# Patient Record
Sex: Female | Born: 1970 | Race: White | Hispanic: No | Marital: Married | Smoking: Never smoker
Health system: Southern US, Community
[De-identification: ages and names within clinical notes are randomized; demographics above are authoritative.]

## PROBLEM LIST (undated history)

## (undated) DIAGNOSIS — G43909 Migraine, unspecified, not intractable, without status migrainosus: Secondary | ICD-10-CM

## (undated) DIAGNOSIS — R87629 Unspecified abnormal cytological findings in specimens from vagina: Secondary | ICD-10-CM

## (undated) HISTORY — PX: REDUCTION MAMMAPLASTY: SUR839

## (undated) HISTORY — DX: Migraine, unspecified, not intractable, without status migrainosus: G43.909

## (undated) HISTORY — PX: WISDOM TOOTH EXTRACTION: SHX21

## (undated) HISTORY — PX: CHOLECYSTECTOMY: SHX55

## (undated) HISTORY — PX: BREAST SURGERY: SHX581

## (undated) HISTORY — DX: Unspecified abnormal cytological findings in specimens from vagina: R87.629

---

## 2004-12-07 ENCOUNTER — Emergency Department (HOSPITAL_COMMUNITY): Admission: EM | Admit: 2004-12-07 | Discharge: 2004-12-07 | Payer: Self-pay | Admitting: Emergency Medicine

## 2004-12-09 ENCOUNTER — Ambulatory Visit (HOSPITAL_COMMUNITY): Admission: RE | Admit: 2004-12-09 | Discharge: 2004-12-09 | Payer: Self-pay | Admitting: Emergency Medicine

## 2004-12-15 ENCOUNTER — Emergency Department (HOSPITAL_COMMUNITY): Admission: EM | Admit: 2004-12-15 | Discharge: 2004-12-15 | Payer: Self-pay | Admitting: Emergency Medicine

## 2004-12-20 ENCOUNTER — Observation Stay (HOSPITAL_COMMUNITY): Admission: RE | Admit: 2004-12-20 | Discharge: 2004-12-21 | Payer: Self-pay | Admitting: General Surgery

## 2008-12-29 ENCOUNTER — Emergency Department (HOSPITAL_COMMUNITY): Admission: EM | Admit: 2008-12-29 | Discharge: 2008-12-29 | Payer: Self-pay | Admitting: Emergency Medicine

## 2009-05-28 ENCOUNTER — Ambulatory Visit (HOSPITAL_BASED_OUTPATIENT_CLINIC_OR_DEPARTMENT_OTHER): Admission: RE | Admit: 2009-05-28 | Discharge: 2009-05-29 | Payer: Self-pay | Admitting: Specialist

## 2009-05-28 ENCOUNTER — Encounter (INDEPENDENT_AMBULATORY_CARE_PROVIDER_SITE_OTHER): Payer: Self-pay | Admitting: Specialist

## 2010-10-16 LAB — POCT HEMOGLOBIN-HEMACUE: Hemoglobin: 14.8 g/dL (ref 12.0–15.0)

## 2010-10-21 LAB — URINE MICROSCOPIC-ADD ON

## 2010-10-21 LAB — URINALYSIS, ROUTINE W REFLEX MICROSCOPIC
Bilirubin Urine: NEGATIVE
Glucose, UA: NEGATIVE mg/dL
Ketones, ur: NEGATIVE mg/dL
Leukocytes, UA: NEGATIVE
Nitrite: NEGATIVE
Specific Gravity, Urine: 1.015 (ref 1.005–1.030)
Urobilinogen, UA: 1 mg/dL (ref 0.0–1.0)
pH: 7.5 (ref 5.0–8.0)

## 2010-11-29 NOTE — H&P (Signed)
Samantha Schroeder, Samantha Schroeder                ACCOUNT NO.:  [id number]   MEDICAL RECORD NO.:  [id number]          PATIENT TYPE:  AMB   LOCATION:  DAY                           FACILITY:  [facility]   PHYSICIAN:  Jerolyn Shin C. Katrinka Blazing, M.D.   DATE OF BIRTH:  1970-11-13   DATE OF ADMISSION:  DATE OF DISCHARGE:  LH                                HISTORY & PHYSICAL   HISTORY OF PRESENT ILLNESS:  A 40 year old female with a history of  recurrent abdominal pain.  She has had 2 major attacks.  Her pain is  epigastric with radiation into the right side, but not to the back.  She has  nausea, but no vomiting or diarrhea.  Ultrasound revealed multiple  gallstones.  The patient is scheduled for a cholecystectomy.   PAST HISTORY:  No major medical illnesses.   MEDICATIONS:  No chronic medications.   ALLERGIES:  No allergies.   PAST SURGICAL HISTORY:  No surgeries.   FAMILY HISTORY:  Positive for diabetes mellitus, congestive heart failure,  hypertension, atherosclerotic heart disease, stroke, and depression.   SOCIAL HISTORY:  She is married with 2 children.  Works as an Engineer, manufacturing.  Drinks an occasional alcoholic beverage.  No tobacco use.  No drug  use.   PHYSICAL EXAMINATION:  VITAL SIGNS:  Blood pressure 120/90, pulse 76,  respirations 20, weight 139 pounds.  HEENT:  Unremarkable.  NECK:  Supple.  No JVD, bruit, adenopathy, or thyromegaly.  CHEST:  Clear to auscultation.  HEART:  Regular rate and rhythm without murmur, gallop, or rub.  ABDOMEN:  Mild epigastric tenderness.  No right upper quadrant tenderness.  Normoactive bowel sounds.  No hepatosplenomegaly.  EXTREMITIES:  No clubbing, cyanosis, or edema.  NEUROLOGIC:  No focal motor, sensory, or cerebellar deficit.   IMPRESSION:  Cholelithiasis with cholecystitis.   PLAN:  Laparoscopic cholecystectomy.       LCS/MEDQ  D:  12/19/2004  T:  12/20/2004  Job:  [id number]

## 2010-11-29 NOTE — Op Note (Signed)
NAMECHEVY, Samantha Schroeder                ACCOUNT NO.:  [id number]   MEDICAL RECORD NO.:  [id number]          PATIENT TYPE:  OBV   LOCATION:  A326                          FACILITY:  [facility]   PHYSICIAN:  Dirk Dress. Katrinka Blazing, M.D.   DATE OF BIRTH:  1971/04/12   DATE OF PROCEDURE:  12/20/2004  DATE OF DISCHARGE:                                 OPERATIVE REPORT   PREOPERATIVE DIAGNOSIS:  Cholelithiasis, cholecystitis.   POSTOPERATIVE DIAGNOSES:  1.  Cholelithiasis, cholecystitis.  2.  Intraperitoneal hemorrhage of uncertain etiology.   PROCEDURE:  Laparoscopic cholecystectomy, peritoneal lavage, pelvic  laparoscopy.   SURGEON:  Dr. Katrinka Blazing.   DESCRIPTION:  Under general anesthesia, the patient's abdomen was prepped  and draped in sterile field. Supraumbilical incision was made, and Veress  needle was inserted uneventfully. Using a Visiport guide, a 10-mm port was  placed. Upon placing the laparoscopic, there was evidence of old blood in  the peritoneal cavity. There was old blood over the omentum that was sort of  in a speckled pattern. There was old blood in the left subphrenic area with  some chronic adhesions surrounding the spleen. There was blood between loops  of bowel and following the colon in the gutters. There was also a large  volume of blood in the pelvis over the uterus and in the cul-de-sac. This  was very, very blood which was not clotted. The uterus was stuck to the  anterior abdominal wall anteriorly, suggesting previous Cesarean section.  The ovaries appeared to be normal bilaterally. Left and right ovary were  fully evaluated. The tubes were normal bilaterally. Once the uterus was  evaluated, it appeared to be multilobular and nodular but was soft. The  bowel was followed starting at the cecum and extending up to the subhepatic  space and starting in the rectum and extending up to the splenic flexure.  The omentum was irrigated and moved with peanut dissectors, and there was  no  evidence of any primary bleeding involved in the small bowel or the  mesentery. Copious irrigation of the pelvis was carried out, and once these  was done, there was no residual bleeding noted. The area of the spleen was  irrigated, and the subhepatic spaces were both irrigated, and once this was  done, there did not appear to be any further bleeding. This appeared to be  very old blood. It was very thick but not clotted. It was elected to proceed  with the cholecystectomy. The ports in the suprapubic midline and the left  lower quadrant were left in place. New ports were placed in the right  subcostal region under fluoroscopic guidance. Gallbladder was grasped and  positioned. Cystic artery was a large vessel that extended onto the wall of  the gallbladder. It was dissected, clipped with four clips and divided  closed to the gallbladder. Cystic duct was short. It was dissected, clipped  closed to the gallbladder with five clips and divided. Gallbladder was then  separated from the intrahepatic space without difficulty. Was placed in an  EndoCatch device and retrieved. Irrigation was carried out. There  was no  fresh bleeding, but just to make sure, I elected to place a JP drain in the  right subhepatic space. This was brought out through the most lateral port  site. CO2 was allowed to escape from the abdomen, and the ports were  removed. The incisions were closed. The fascia of the supraumbilical  incision and the right upper quadrant paramedian incision  was closed with 0 Vicryl. All of the other skin incisions were closed with  staples. OpSite dressings were placed. The patient tolerated the procedure  well. She was awakened from anesthesia uneventfully, transferred to a bed,  and taken to the post anesthetic care unit for monitoring.       LCS/MEDQ  D:  12/20/2004  T:  12/20/2004  Job:  [id number]   cc:   Madelin Rear. Sherwood Gambler, MD  [id number]  [place]  [place] [address]  Fax:  [phone]

## 2011-10-07 ENCOUNTER — Ambulatory Visit: Payer: Self-pay | Admitting: Urology

## 2013-03-28 ENCOUNTER — Encounter (HOSPITAL_COMMUNITY): Payer: Self-pay | Admitting: *Deleted

## 2013-03-28 ENCOUNTER — Emergency Department (HOSPITAL_COMMUNITY)
Admission: EM | Admit: 2013-03-28 | Discharge: 2013-03-28 | Disposition: A | Discharge status: Home / Self Care | Payer: Self-pay | Attending: Emergency Medicine | Admitting: Emergency Medicine

## 2013-03-28 ENCOUNTER — Emergency Department (HOSPITAL_COMMUNITY): Payer: Self-pay

## 2013-03-28 DIAGNOSIS — K7689 Other specified diseases of liver: Secondary | ICD-10-CM

## 2013-03-28 DIAGNOSIS — Z3202 Encounter for pregnancy test, result negative: Secondary | ICD-10-CM | POA: Insufficient documentation

## 2013-03-28 DIAGNOSIS — Y9289 Other specified places as the place of occurrence of the external cause: Secondary | ICD-10-CM | POA: Insufficient documentation

## 2013-03-28 DIAGNOSIS — R296 Repeated falls: Secondary | ICD-10-CM | POA: Insufficient documentation

## 2013-03-28 DIAGNOSIS — N200 Calculus of kidney: Secondary | ICD-10-CM

## 2013-03-28 DIAGNOSIS — Y9389 Activity, other specified: Secondary | ICD-10-CM | POA: Insufficient documentation

## 2013-03-28 DIAGNOSIS — N281 Cyst of kidney, acquired: Secondary | ICD-10-CM

## 2013-03-28 DIAGNOSIS — IMO0002 Reserved for concepts with insufficient information to code with codable children: Secondary | ICD-10-CM | POA: Insufficient documentation

## 2013-03-28 LAB — URINE MICROSCOPIC-ADD ON

## 2013-03-28 LAB — BASIC METABOLIC PANEL
BUN: 18 mg/dL (ref 6–23)
CO2: 28 mEq/L (ref 19–32)
Calcium: 9.9 mg/dL (ref 8.4–10.5)
Chloride: 103 mEq/L (ref 96–112)
Creatinine, Ser: 1.03 mg/dL (ref 0.50–1.10)
GFR calc Af Amer: 78 mL/min — ABNORMAL LOW (ref >90)
GFR calc non Af Amer: 67 mL/min — ABNORMAL LOW (ref >90)
Glucose, Bld: 101 mg/dL — ABNORMAL HIGH (ref 70–99)
Potassium: 3.4 mEq/L — ABNORMAL LOW (ref 3.5–5.1)
Sodium: 139 mEq/L (ref 135–145)

## 2013-03-28 LAB — CBC WITH DIFFERENTIAL/PLATELET
Basophils Absolute: 0 10*3/uL (ref 0.0–0.1)
Basophils Relative: 0 % (ref 0–1)
Eosinophils Absolute: 0.1 10*3/uL (ref 0.0–0.7)
Eosinophils Relative: 1 % (ref 0–5)
HCT: 41.5 % (ref 36.0–46.0)
Hemoglobin: 14 g/dL (ref 12.0–15.0)
Lymphocytes Relative: 31 % (ref 12–46)
Lymphs Abs: 2.8 10*3/uL (ref 0.7–4.0)
MCH: 29.2 pg (ref 26.0–34.0)
MCHC: 33.7 g/dL (ref 30.0–36.0)
MCV: 86.6 fL (ref 78.0–100.0)
Monocytes Absolute: 0.8 10*3/uL (ref 0.1–1.0)
Monocytes Relative: 8 % (ref 3–12)
Neutro Abs: 5.5 10*3/uL (ref 1.7–7.7)
Neutrophils Relative %: 59 % (ref 43–77)
Platelets: 224 10*3/uL (ref 150–400)
RBC: 4.79 MIL/uL (ref 3.87–5.11)
RDW: 12.3 % (ref 11.5–15.5)
WBC: 9.2 10*3/uL (ref 4.0–10.5)

## 2013-03-28 LAB — URINALYSIS, ROUTINE W REFLEX MICROSCOPIC
Bilirubin Urine: NEGATIVE
Glucose, UA: NEGATIVE mg/dL
Ketones, ur: NEGATIVE mg/dL
Nitrite: NEGATIVE
Protein, ur: NEGATIVE mg/dL
Specific Gravity, Urine: >1.03 — ABNORMAL HIGH (ref 1.005–1.030)
Urobilinogen, UA: 0.2 mg/dL (ref 0.0–1.0)
pH: 6 (ref 5.0–8.0)

## 2013-03-28 LAB — POCT PREGNANCY, URINE: Preg Test, Ur: NEGATIVE

## 2013-03-28 MED ORDER — SODIUM CHLORIDE 0.9 % IV SOLN
1000.0000 mL | INTRAVENOUS | Status: DC
  Administered 2013-03-28: 1000 mL via INTRAVENOUS

## 2013-03-28 MED ORDER — ONDANSETRON HCL 4 MG/2ML IJ SOLN
4.0000 mg | Freq: Once | INTRAMUSCULAR | Status: AC
  Administered 2013-03-28: 4 mg via INTRAVENOUS
  Filled 2013-03-28: qty 2

## 2013-03-28 MED ORDER — NAPROXEN 500 MG PO TABS: 500.0000 mg | ORAL_TABLET | Freq: Two times a day (BID) | ORAL | Status: DC

## 2013-03-28 MED ORDER — TAMSULOSIN HCL 0.4 MG PO CAPS: 0.4000 mg | ORAL_CAPSULE | Freq: Two times a day (BID) | ORAL | Status: DC

## 2013-03-28 MED ORDER — HYDROMORPHONE HCL PF 1 MG/ML IJ SOLN
1.0000 mg | Freq: Once | INTRAMUSCULAR | Status: AC
  Administered 2013-03-28: 1 mg via INTRAVENOUS
  Filled 2013-03-28: qty 1

## 2013-03-28 MED ORDER — SODIUM CHLORIDE 0.9 % IV SOLN
1000.0000 mL | Freq: Once | INTRAVENOUS | Status: AC
  Administered 2013-03-28: 1000 mL via INTRAVENOUS

## 2013-03-28 MED ORDER — PROMETHAZINE HCL 25 MG PO TABS: 25.0000 mg | ORAL_TABLET | Freq: Four times a day (QID) | ORAL | Status: DC | PRN

## 2013-03-28 MED ORDER — OXYCODONE-ACETAMINOPHEN 5-325 MG PO TABS
2.0000 | ORAL_TABLET | Freq: Once | ORAL | Status: AC
  Administered 2013-03-28: 2 via ORAL
  Filled 2013-03-28: qty 2

## 2013-03-28 MED ORDER — OXYCODONE-ACETAMINOPHEN 5-325 MG PO TABS: 1.0000 | ORAL_TABLET | ORAL | Status: DC | PRN

## 2013-03-28 MED ORDER — KETOROLAC TROMETHAMINE 30 MG/ML IJ SOLN
30.0000 mg | Freq: Once | INTRAMUSCULAR | Status: AC
  Administered 2013-03-28: 30 mg via INTRAVENOUS
  Filled 2013-03-28: qty 1

## 2013-03-28 NOTE — ED Notes (Signed)
Pt c/o lower mid to left sided back pain that radiates around to her left groin. Decreased urine output and pain upon urination. Denies n/v.

## 2013-03-28 NOTE — ED Provider Notes (Signed)
CSN: [id number]     Arrival date & time 03/28/13  0608 History   First MD Initiated Contact with Patient 03/28/13 [phone]     Chief Complaint  Patient presents with  . Back Pain  . Groin Pain   (Consider location/radiation/quality/duration/timing/severity/associated sxs/prior Treatment) Patient is a 42 y.o. female presenting with back pain and groin pain. The history is provided by the patient.  Back Pain Groin Pain  She states that yesterday she fell landing on her butt. Later in the day, she started having pain in her left flank area radiating around to her left groin. She took Aleve with relief of pain but does continue to get worse. This morning, she took Aleve with no relief of pain. Pain is severe and she rates it at 8/10. Nothing makes it better nothing makes it worse. This has been associated with since she has to urinate but inability to urinate. She had nausea yesterday but none today. She has not vomited. She denies fever, chills, sweats. She's not had similar symptoms before.  History reviewed. No pertinent past medical history. No past surgical history on file. No family history on file. History  Substance Use Topics  . Smoking status: Not on file  . Smokeless tobacco: Not on file  . Alcohol Use: Not on file   OB History   Grav Para Term Preterm Abortions TAB SAB Ect Mult Living                 Review of Systems  Musculoskeletal: Positive for back pain.  All other systems reviewed and are negative.    Allergies  Review of patient's allergies indicates not on file.  Home Medications  No current outpatient prescriptions on file. BP 155/96  Temp(Src) 98.3 F (36.8 C) (Oral)  Resp 20  Ht 5\' 4"  (1.626 m)  Wt 175 lb (79.379 kg)  BMI 30.02 kg/m2  SpO2 100% Physical Exam  Nursing note and vitals reviewed.  42 year old female, who appears mildly uncomfortable, but is in no acute distress. Vital signs are significant for hypertension with blood pressure 155/96.  Oxygen saturation is 100%, which is normal. Head is normocephalic and atraumatic. PERRLA, EOMI. Oropharynx is clear. Neck is nontender and supple without adenopathy or JVD. Back is nontender and there is mild left CVA tenderness. Lungs are clear without rales, wheezes, or rhonchi. Chest is nontender. Heart has regular rate and rhythm without murmur. Abdomen is soft, flat, with moderate left mid and lower abdominal tenderness. There is no rebound or guarding. There are no masses or hepatosplenomegaly and peristalsis is hypoactive. Extremities have no cyanosis or edema, full range of motion is present. Skin is warm and dry without rash. Neurologic: Mental status is normal, cranial nerves are intact, there are no motor or sensory deficits.  ED Course  Procedures (including critical care time) Labs Review Results for orders placed during the hospital encounter of 03/28/13  CBC WITH DIFFERENTIAL      Result Value Range   WBC 9.2  4.0 - 10.5 K/uL   RBC 4.79  3.87 - 5.11 MIL/uL   Hemoglobin 14.0  12.0 - 15.0 g/dL   HCT 11.9  14.7 - 82.9 %   MCV 86.6  78.0 - 100.0 fL   MCH 29.2  26.0 - 34.0 pg   MCHC 33.7  30.0 - 36.0 g/dL   RDW 56.2  13.0 - 86.5 %   Platelets 224  150 - 400 K/uL   Neutrophils Relative % 59  43 - 77 %   Neutro Abs 5.5  1.7 - 7.7 K/uL   Lymphocytes Relative 31  12 - 46 %   Lymphs Abs 2.8  0.7 - 4.0 K/uL   Monocytes Relative 8  3 - 12 %   Monocytes Absolute 0.8  0.1 - 1.0 K/uL   Eosinophils Relative 1  0 - 5 %   Eosinophils Absolute 0.1  0.0 - 0.7 K/uL   Basophils Relative 0  0 - 1 %   Basophils Absolute 0.0  0.0 - 0.1 K/uL  BASIC METABOLIC PANEL      Result Value Range   Sodium 139  135 - 145 mEq/L   Potassium 3.4 (*) 3.5 - 5.1 mEq/L   Chloride 103  96 - 112 mEq/L   CO2 28  19 - 32 mEq/L   Glucose, Bld 101 (*) 70 - 99 mg/dL   BUN 18  6 - 23 mg/dL   Creatinine, Ser 4.09  0.50 - 1.10 mg/dL   Calcium 9.9  8.4 - 81.1 mg/dL   GFR calc non Af Amer 67 (*) >90 mL/min    GFR calc Af Amer 78 (*) >90 mL/min  URINALYSIS, ROUTINE W REFLEX MICROSCOPIC      Result Value Range   Color, Urine YELLOW  YELLOW   APPearance CLEAR  CLEAR   Specific Gravity, Urine >1.030 (*) 1.005 - 1.030   pH 6.0  5.0 - 8.0   Glucose, UA NEGATIVE  NEGATIVE mg/dL   Hgb urine dipstick LARGE (*) NEGATIVE   Bilirubin Urine NEGATIVE  NEGATIVE   Ketones, ur NEGATIVE  NEGATIVE mg/dL   Protein, ur NEGATIVE  NEGATIVE mg/dL   Urobilinogen, UA 0.2  0.0 - 1.0 mg/dL   Nitrite NEGATIVE  NEGATIVE   Leukocytes, UA TRACE (*) NEGATIVE  URINE MICROSCOPIC-ADD ON      Result Value Range   Squamous Epithelial / LPF MANY (*) RARE   WBC, UA 0-2  <3 WBC/hpf   RBC / HPF 7-10  <3 RBC/hpf   Bacteria, UA MANY (*) RARE  POCT PREGNANCY, URINE      Result Value Range   Preg Test, Ur NEGATIVE  NEGATIVE    Imaging Review Ct Abdomen Pelvis Wo Contrast  03/28/2013   *RADIOLOGY REPORT*  Clinical Data: Left flank and groin pain.  CT ABDOMEN AND PELVIS WITHOUT CONTRAST  Technique:  Multidetector CT imaging of the abdomen and pelvis was performed following the standard protocol without intravenous contrast.  Comparison: 12/09/2004 ultrasound.  Findings: 4.7 mm stone at the left ureteral vesicle junction with moderate left-sided hydroureteronephrosis. Fluid surrounds the left kidney is slightly enlarged.  Post cholecystectomy.  Left lobe liver low density structures measuring up to 1.5 cm with larger low density structure a cyst and smaller low density structure too small to adequately characterize. Evaluation of solid abdominal viscera is limited by lack of IV contrast.  Taking this limitation into account no worrisome hepatic, splenic, pancreatic, adrenal or left renal mass.  Within the lower pole of the right kidney is a 2 cm low density structure suggestive of a cyst.  Lung bases clear.  Heart size within normal limits.  No abdominal aortic aneurysm.  Retroverted uterus with IUD in place.  No bony destructive lesion.   IMPRESSION: 4.7 mm stone at the left ureteral vesicle junction with moderate left-sided hydroureteronephrosis. Fluid surrounds the left kidney is slightly enlarged.  Low density structures within the left lobe of the liver and lower aspect of the right  kidney suggestive of cysts.   Original Report Authenticated By: Lacy Duverney, M.D.    MDM   1. Kidney stone on left side   2. Renal cyst   3. Hepatic cyst    Left flank pain radiating to the groin suspicious for ureteral colic. Also consider possibility of urinary tract infection. Urine sample will be obtained and she will be sent for CT of her abdomen and pelvis without contrast. She'll be given IV fluids, IV hydromorphone, and IV ondansetron. Old records are reviewed and I see no relevant past visits. She has not had imaging of her abdomen since an ultrasound in 2006. There is no mention of renal calculi at that time.    Dione Booze, MD 03/29/13 [phone]

## 2013-03-28 NOTE — ED Notes (Signed)
Pt alert & oriented x4, stable gait. Patient given discharge instructions, paperwork & prescription(s). Patient  instructed to stop at the registration desk to finish any additional paperwork. Patient verbalized understanding. Pt left department w/ no further questions. 

## 2013-03-28 NOTE — ED Provider Notes (Signed)
Pt informed of CT results - stable for d/c.  Meds given in ED:  Medications  0.9 %  sodium chloride infusion (0 mLs Intravenous Stopped 03/28/13 0726)    Followed by  0.9 %  sodium chloride infusion (1,000 mLs Intravenous New Bag/Given 03/28/13 0730)  ketorolac (TORADOL) 30 MG/ML injection 30 mg (not administered)  oxyCODONE-acetaminophen (PERCOCET/ROXICET) 5-325 MG per tablet 2 tablet (not administered)  HYDROmorphone (DILAUDID) injection 1 mg (1 mg Intravenous Given 03/28/13 0643)  ondansetron (ZOFRAN) injection 4 mg (4 mg Intravenous Given 03/28/13 0643)    New Prescriptions   NAPROXEN (NAPROSYN) 500 MG TABLET    Take 1 tablet (500 mg total) by mouth 2 (two) times daily with a meal.   OXYCODONE-ACETAMINOPHEN (PERCOCET) 5-325 MG PER TABLET    Take 1 tablet by mouth every 4 (four) hours as needed for pain.   PROMETHAZINE (PHENERGAN) 25 MG TABLET    Take 1 tablet (25 mg total) by mouth every 6 (six) hours as needed for nausea.   TAMSULOSIN (FLOMAX) 0.4 MG CAPS CAPSULE    Take 1 capsule (0.4 mg total) by mouth 2 (two) times daily.      Vida Roller, MD 03/28/13 [phone]

## 2013-07-14 DIAGNOSIS — R87629 Unspecified abnormal cytological findings in specimens from vagina: Secondary | ICD-10-CM

## 2013-07-14 HISTORY — DX: Unspecified abnormal cytological findings in specimens from vagina: R87.629

## 2014-02-01 LAB — HM PAP SMEAR: HM Pap smear: NEGATIVE

## 2014-03-06 ENCOUNTER — Encounter: Payer: Self-pay | Admitting: Obstetrics and Gynecology

## 2014-03-06 ENCOUNTER — Ambulatory Visit (INDEPENDENT_AMBULATORY_CARE_PROVIDER_SITE_OTHER): Payer: BC Managed Care – PPO | Admitting: Obstetrics and Gynecology

## 2014-03-06 VITALS — BP 140/82 | Ht 64.0 in | Wt 165.6 lb

## 2014-03-06 DIAGNOSIS — N946 Dysmenorrhea, unspecified: Secondary | ICD-10-CM

## 2014-03-06 NOTE — Progress Notes (Signed)
This chart was scribed by Leone Payor, Medical Scribe, for Dr. Christin Bach on 03/06/14 at 4:19 PM. This chart was reviewed by Dr. Christin Bach for accuracy.   [facility] Visit  Patient name: Samantha Schroeder MRN [id number]  Date of birth: 09/17/1970  CC & HPI:  Samantha Schroeder is a 43 y.o. female presenting today for second opinion on hysterectomy. Patient states she had an IUD replaced earlier this year. She reports having complications with it so she had it removed. Patient states she was told to have a vaginal hysterectomy but she would like to have a second opinion.  Pt has had 2 iud's the first worked after the first 8 months to pt satisfaction, the second was never satisfactory, and workup well documented. Excellent records reviewed , showing u/s documenting u/s abnormality suggestive of adenomyosis.. In a small uterus with no significant endometrial thickening.  ROS:  +dysmenorrhea   Pertinent History Reviewed:   Reviewed: Significant for 2 vaginal deliveries  Medical Hx:        Past Medical History  Diagnosis Date  . Migraine                               Surgical Hx:    Past Surgical History  Procedure Laterality Date  . Cholecystectomy    . Breast surgery      reduction  . Wisdom tooth extraction     Medications: Reviewed & Updated - see associated section                      Current outpatient prescriptions:Loratadine (CLARITIN PO), Take by mouth as needed., Disp: , Rfl: ;  naproxen (NAPROSYN) 500 MG tablet, Take 1 tablet (500 mg total) by mouth 2 (two) times daily with a meal., Disp: 30 tablet, Rfl: 0;  SUMAtriptan (IMITREX) 50 MG tablet, Take 50 mg by mouth every 2 (two) hours as needed for migraine or headache. May repeat in 2 hours if headache persists or recurs., Disp: , Rfl:  oxyCODONE-acetaminophen (PERCOCET) 5-325 MG per tablet, Take 1 tablet by mouth every 4 (four) hours as needed for pain., Disp: 20 tablet, Rfl: 0;  promethazine (PHENERGAN) 25 MG tablet,  Take 1 tablet (25 mg total) by mouth every 6 (six) hours as needed for nausea., Disp: 12 tablet, Rfl: 0;  tamsulosin (FLOMAX) 0.4 MG CAPS capsule, Take 1 capsule (0.4 mg total) by mouth 2 (two) times daily., Disp: 10 capsule, Rfl: 0   Social History: Reviewed -  reports that she has never smoked. She does not have any smokeless tobacco history on file.  Objective Findings:  Vitals: Blood pressure 140/82, height  (1.626 m), weight 165 lb 9.6 oz (75.116 kg), last menstrual period 03/05/2014.  Physical Examination: General appearance - alert, well appearing, and in no distress and oriented to person, place, and time Mental status - alert, oriented to person, place, and time, normal mood, behavior, speech, dress, motor activity, and thought processes Pelvic - examination not indicated  Surgery discussion: at least 25 min  Assessment & Plan:   A:  1. Dysmenorrhea  2. Adenomyosis  3. Status post failed IUD   P:  1. Treatment regimen reviewed, agreed with proceeding toward hysterectomy, vaginal vs other techniques discussed (such as LAVH) 2. All other options, surgical technique, discussed including salpingectomy

## 2014-03-13 ENCOUNTER — Telehealth: Payer: Self-pay | Admitting: Obstetrics and Gynecology

## 2014-03-13 NOTE — Telephone Encounter (Signed)
I spoke with JVF about the pt's questions and he advised that the pt would need an appointment to discuss options and that he would go over her chart and discuss with Dr.McLeod.   I will notify pt of this.

## 2014-03-13 NOTE — Telephone Encounter (Signed)
Pt states that she was seen a week or so ago for a second opinion. Pt states that JVF told her that he could take her tubes out as well. Pt has thought over everything and now she thinks she wants an abdominal hysterectomy with a tube removal and a tummy tuck. I did advise the pt that the tummy tuck is not usually covered by your insurance, pt asked if there was a way to work a round all of that. Pt sates that he did this same procedure for one of her friends in the past.  I advised the pt that I would discuss all of this with Dr. Emelda Fear and call her back, pt aware that he is seeing pt's all day and that it would be later before she heard from [place].

## 2014-03-13 NOTE — Telephone Encounter (Signed)
Call transferred to front staff for an appt to be made with Dr. Emelda Fear to discuss pt concerns and questions.

## 2014-03-16 ENCOUNTER — Ambulatory Visit (INDEPENDENT_AMBULATORY_CARE_PROVIDER_SITE_OTHER): Payer: BC Managed Care – PPO | Admitting: Obstetrics and Gynecology

## 2014-03-16 ENCOUNTER — Encounter: Payer: Self-pay | Admitting: Obstetrics and Gynecology

## 2014-03-16 VITALS — BP 142/88 | Ht 64.0 in | Wt 166.0 lb

## 2014-03-16 DIAGNOSIS — N8000 Endometriosis of the uterus, unspecified: Secondary | ICD-10-CM

## 2014-03-16 DIAGNOSIS — N8003 Adenomyosis of the uterus: Secondary | ICD-10-CM

## 2014-03-16 DIAGNOSIS — N809 Endometriosis, unspecified: Principal | ICD-10-CM

## 2014-03-16 MED ORDER — PHENTERMINE HCL 37.5 MG PO CAPS: 37.5000 mg | ORAL_CAPSULE | ORAL | Status: DC

## 2014-03-16 NOTE — Progress Notes (Signed)
Patient ID: UMI MAINOR, female   DOB: 1971/03/25, 43 y.o.   MRN: [id number] Pt here today    [facility] Visit  Patient name: ANAMARI GALEAS MRN [id number]  Date of birth: [birth year]  CC & HPI:  SAMARI BITTINGER is a 43 y.o. female presenting today to discuss surgery one more time. She would like to have the surgery within the next couple of months.  She las lost 15 pounds since March 2015.Pt states that since her last visit she has found out that her maternal great aunt had ovarian cancer.     ROS:  All systems are reviewed and negative unless otherwise specified in the HPI.    Pertinent History Reviewed:   Reviewed:  Medical         Past Medical History  Diagnosis Date  . Migraine                               Surgical Hx:    Past Surgical History  Procedure Laterality Date  . Cholecystectomy    . Breast surgery      reduction  . Wisdom tooth extraction     Medications: Reviewed & Updated - see associated section                      Current outpatient prescriptions:Loratadine (CLARITIN PO), Take by mouth as needed., Disp: , Rfl: ;  SUMAtriptan (IMITREX) 50 MG tablet, Take 50 mg by mouth every 2 (two) hours as needed for migraine or headache. May repeat in 2 hours if headache persists or recurs., Disp: , Rfl: ;  naproxen (NAPROSYN) 500 MG tablet, Take 1 tablet (500 mg total) by mouth 2 (two) times daily with a meal., Disp: 30 tablet, Rfl: 0 oxyCODONE-acetaminophen (PERCOCET) 5-325 MG per tablet, Take 1 tablet by mouth every 4 (four) hours as needed for pain., Disp: 20 tablet, Rfl: 0;  promethazine (PHENERGAN) 25 MG tablet, Take 1 tablet (25 mg total) by mouth every 6 (six) hours as needed for nausea., Disp: 12 tablet, Rfl: 0;  tamsulosin (FLOMAX) 0.4 MG CAPS capsule, Take 1 capsule (0.4 mg total) by mouth 2 (two) times daily., Disp: 10 capsule, Rfl: 0   Social History: Reviewed -  reports that she has never smoked. She has never used smokeless tobacco.  Objective  Findings:  Vitals: Blood pressure 142/88, height  (1.626 m), weight 166 lb (75.297 kg), last menstrual period 03/05/2014.  Physical Examination: Lengthy discussion concerning surgery.     Assessment & Plan:   A:  1. Adenomyosis, 2 Family history of ovarian cancer 3.Desire for salpingectomy 4.dysmenorrhea. 5. Desire for tummy tuck at hysterectomy.  P:  1. Follow-up in 4 wks Rx phentermine  This chart was scribed by Carl Best, Medical Scribe, for Dr. Christin Bach on 03/16/2014 at 4:35 PM. This chart was reviewed by Dr. Christin Bach for accuracy.

## 2014-04-11 ENCOUNTER — Encounter: Payer: Self-pay | Admitting: *Deleted

## 2014-04-12 ENCOUNTER — Encounter: Payer: Self-pay | Admitting: Obstetrics and Gynecology

## 2014-04-12 ENCOUNTER — Ambulatory Visit (INDEPENDENT_AMBULATORY_CARE_PROVIDER_SITE_OTHER): Payer: BC Managed Care – PPO | Admitting: Obstetrics and Gynecology

## 2014-04-12 VITALS — BP 110/66 | Ht 64.0 in | Wt 158.0 lb

## 2014-04-12 DIAGNOSIS — N8000 Endometriosis of the uterus, unspecified: Secondary | ICD-10-CM | POA: Insufficient documentation

## 2014-04-12 DIAGNOSIS — IMO0002 Reserved for concepts with insufficient information to code with codable children: Secondary | ICD-10-CM

## 2014-04-12 DIAGNOSIS — N854 Malposition of uterus: Secondary | ICD-10-CM | POA: Insufficient documentation

## 2014-04-12 NOTE — Patient Instructions (Signed)
Top be scheduled for Abd hyst , bilateral removal of fallopian tubes, and tummy tuck(panniculectomy) Expect a call from Dawn from insurance.

## 2014-04-12 NOTE — Progress Notes (Signed)
   [facility] Visit  Patient name: Samantha Schroeder MRN [id number]  Date of birth: 10/11/1970  CC & HPI:  Samantha Schroeder is a 43 y.o. female presenting today for a follow up from last visit where hysterectomy was discussed. She states that she is having associated symptoms of dysmenorrhea. Pt states that she thinks today's visit is to get everything scheduled for surgery. She denies any other associated symptoms.   ROS:  +Dysmenorrhea No other complaints  Pertinent History Reviewed:   Reviewed: Significant for  Medical         Past Medical History  Diagnosis Date  . Migraine   . 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 Cholecystectomy    . Breast surgery      reduction  . Wisdom tooth extraction     Medications: Reviewed & Updated - see associated section                      Current outpatient prescriptions:Loratadine (CLARITIN PO), Take by mouth as needed., Disp: , Rfl: ;  phentermine 37.5 MG capsule, Take 1 capsule (37.5 mg total) by mouth every morning., Disp: 30 capsule, Rfl: 1;  SUMAtriptan (IMITREX) 50 MG tablet, Take 50 mg by mouth every 2 (two) hours as needed for migraine or headache. May repeat in 2 hours if headache persists or recurs., Disp: , Rfl:    Social History: Reviewed -  reports that she has never smoked. She has never used smokeless tobacco.  Objective Findings:  Vitals: Blood pressure 110/66, height 5\' 4"  (1.626 m), weight 158 lb (71.668 kg), last menstrual period 03/26/2014.  Physical Examination:  Pelvic - normal external genitalia, vulva, vagina, cervix, uterus and adnexa,  VAGINA: normal appearing vagina with normal color and discharge, no lesions, 1st degree uterine descensus   CERVIX: normal appearing cervix without discharge or lesions, large cervix, transverse 4cm diameter  UTERUS: uterus is normal size, shape,  consistency and nontender, retroverted, 1st degree uterine descensus     Assessment & Plan:   A:  1. Dysmenorrhea 2. Adenomyosis 3. Abdominal wall laxity.   P:  1. Schedule Abdominal Hysterectomy  With Bilateral salpingectomy 2. Tummy Tuck sketched out as apart of the visit today     This chart was scribed for Tilda BurrowJohn Ziyanna Tolin V, MD by Chestine SporeSoijett Blue, ED Scribe. The patient was seen in room 2 at 3:39 PM.

## 2014-04-12 NOTE — Progress Notes (Signed)
Patient ID: Samantha Schroeder, female   DOB: 1970/07/28, 43 y.o.   MRN: [id number] Pt here today as follow up from last visit. Pt states that she thinks today's visit is to get everything scheduled for surgery.

## 2014-04-13 ENCOUNTER — Encounter (HOSPITAL_COMMUNITY): Payer: Self-pay | Admitting: Pharmacy Technician

## 2014-04-13 ENCOUNTER — Ambulatory Visit: Payer: BC Managed Care – PPO | Admitting: Obstetrics and Gynecology

## 2014-04-19 ENCOUNTER — Other Ambulatory Visit: Payer: Self-pay | Admitting: Obstetrics and Gynecology

## 2014-04-19 NOTE — Patient Instructions (Signed)
Samantha Schroeder  04/19/2014   Your procedure is scheduled on:  04/25/2014  Report to [facility] at 6:15 AM.  Call this number if you have problems the morning of surgery: [phone]   Remember:   Do not eat food or drink liquids after midnight.   Take these medicines the morning of surgery with A SIP OF WATER:    Claritin, Imitrex   Do not wear jewelry, make-up or nail polish.  Do not wear lotions, powders, or perfumes. You may wear deodorant.  Do not shave 48 hours prior to surgery. Men may shave face and neck.  Do not bring valuables to the hospital.  [facility] is not responsible for any belongings or valuables.               Contacts, dentures or bridgework may not be worn into surgery.  Leave suitcase in the car. After surgery it may be brought to your room.  For patients admitted to the hospital, discharge time is determined by your treatment team.               Patients discharged the day of surgery will not be allowed to drive home.  Name and phone number of your driver:   Special Instructions: Shower using CHG 2 nights before surgery and the night before surgery.  If you shower the day of surgery use CHG.  Use special wash - you have one bottle of CHG for all showers.  You should use approximately 1/3 of the bottle for each shower.   Please read over the following fact sheets that you were given: Surgical Site Infection Prevention and Anesthesia Post-op Instructions   PATIENT INSTRUCTIONS POST-ANESTHESIA  IMMEDIATELY FOLLOWING SURGERY:  Do not drive or operate machinery for the first twenty four hours after surgery.  Do not make any important decisions for twenty four hours after surgery or while taking narcotic pain medications or sedatives.  If you develop intractable nausea and vomiting or a severe headache please notify your doctor immediately.  FOLLOW-UP:  Please make an appointment with your surgeon as instructed. You do not need to follow up with anesthesia unless  specifically instructed to do so.  WOUND CARE INSTRUCTIONS (if applicable):  Keep a dry clean dressing on the anesthesia/puncture wound site if there is drainage.  Once the wound has quit draining you may leave it open to air.  Generally you should leave the bandage intact for twenty four hours unless there is drainage.  If the epidural site drains for more than 36-48 hours please call the anesthesia department.  QUESTIONS?:  Please feel free to call your physician or the hospital operator if you have any questions, and they will be happy to assist you.      Salpingectomy Salpingectomy, also called tubectomy, is the surgical removal of one of the fallopian tubes. The fallopian tubes are tubes that are connected to the uterus. These tubes transport the egg from the ovary to the uterus. A salpingectomy may be done for various reasons, including:   A tubal (ectopic) pregnancy. This is especially true if the tube ruptures.  An infected fallopian tube.  The need to remove the fallopian tube when removing an ovary with a cyst or tumor.  The need to remove the fallopian tube when removing the uterus.  Cancer of the fallopian tube or nearby organs. Removing one fallopian tube does not prevent you from becoming pregnant. It also does not cause problems with your menstrual periods.  LET [facility] PROVIDER KNOW ABOUT:  Any allergies you have.  All medicines you are taking, including vitamins, herbs, eye drops, creams, and over-the-counter medicines.  Previous problems you or members of your family have had with the use of anesthetics.  Any blood disorders you have.  Previous surgeries you have had.  Medical conditions you have. RISKS AND COMPLICATIONS  Generally, this is a safe procedure. However, as with any procedure, complications can occur. Possible complications include:  Injury to surrounding organs.  Bleeding.  Infection.  Problems related to anesthesia. BEFORE THE  PROCEDURE  Ask your health care provider about changing or stopping your regular medicines. You may need to stop taking certain medicines, such as aspirin or blood thinners, at least 1 week before the surgery.  Do not eat or drink anything for at least 8 hours before the surgery.  If you smoke, do not smoke for at least 2 weeks before the surgery.  Make plans to have someone drive you home after the procedure or after your hospital stay. Also arrange for someone to help you with activities during recovery. PROCEDURE   You will be given medicine to help you relax before the procedure (sedative). You will then be given medicine to make you sleep through the procedure (general anesthetic). These medicines will be given through an IV access tube that is put into one of your veins.  Once you are asleep, your lower abdomen will be shaved and cleaned. A thin, flexible tube (catheter) will be placed in your bladder.  The surgeon may use a laparoscopic, robotic, or open technique for this surgery:  In the laparoscopic technique, the surgery is done through two small cuts (incisions) in the abdomen. A thin, lighted tube with a tiny camera on the end (laparoscope) is inserted into one of the incisions. The tools needed for the procedure are put through the other incision.  A robotic technique may be chosen to perform complex surgery in a small space. In the robotic technique, small incisions will be made. A camera and surgical instruments are passed through the incisions. Surgical instruments will be controlled with the help of a robotic arm.  In the open technique, the surgery is done through one large incision in the abdomen.  Using any of these techniques, the surgeon removes the fallopian tube from where it attaches to the uterus. The blood vessels will be clamped and tied.  The surgeon then uses staples or stitches to close the incision or incisions. AFTER THE PROCEDURE   You will be taken to a  recovery area where your progress will be monitored for 1-3 hours.  If the laparoscopic technique was used, you may be allowed to go home after several hours. You may have some shoulder pain after the laparoscopic procedure. This is normal and usually goes away in a day or two.  If the open technique was used, you will be admitted to the hospital for a couple of days.  You will be given pain medicine if needed.  The IV access tube and catheter will be removed before you are discharged. Document Released: 11/16/2008 Document Revised: 04/20/2013 Document Reviewed: 12/22/2012 [facility] Patient Information 2015 [place], [place]. This information is not intended to replace advice given to you by your health care provider. Make sure you discuss any questions you have with your health care provider. Hysterectomy Information  A hysterectomy is a surgery in which your uterus is removed. This surgery may be done to treat various medical  problems. After the surgery, you will no longer have menstrual periods. The surgery will also make you unable to become pregnant (sterile). The fallopian tubes and ovaries can be removed (bilateral salpingo-oophorectomy) during this surgery as well.  REASONS FOR A HYSTERECTOMY  Persistent, abnormal bleeding.  Lasting (chronic) pelvic pain or infection.  The lining of the uterus (endometrium) starts growing outside the uterus (endometriosis).  The endometrium starts growing in the muscle of the uterus (adenomyosis).  The uterus falls down into the vagina (pelvic organ prolapse).  Noncancerous growths in the uterus (uterine fibroids) that cause symptoms.  Precancerous cells.  Cervical cancer or uterine cancer. TYPES OF HYSTERECTOMIES  Supracervical hysterectomy--In this type, the top part of the uterus is removed, but not the cervix.  Total hysterectomy--The uterus and cervix are removed.  Radical hysterectomy--The uterus, the cervix, and the fibrous tissue that  holds the uterus in place in the pelvis (parametrium) are removed. WAYS A HYSTERECTOMY CAN BE PERFORMED  Abdominal hysterectomy--A large surgical cut (incision) is made in the abdomen. The uterus is removed through this incision.  Vaginal hysterectomy--An incision is made in the vagina. The uterus is removed through this incision. There are no abdominal incisions.  Conventional laparoscopic hysterectomy--Three or four small incisions are made in the abdomen. A thin, lighted tube with a camera (laparoscope) is inserted into one of the incisions. Other tools are put through the other incisions. The uterus is cut into small pieces. The small pieces are removed through the incisions, or they are removed through the vagina.  Laparoscopically assisted vaginal hysterectomy (LAVH)--Three or four small incisions are made in the abdomen. Part of the surgery is performed laparoscopically and part vaginally. The uterus is removed through the vagina.  Robot-assisted laparoscopic hysterectomy--A laparoscope and other tools are inserted into 3 or 4 small incisions in the abdomen. A computer-controlled device is used to give the surgeon a 3D image and to help control the surgical instruments. This allows for more precise movements of surgical instruments. The uterus is cut into small pieces and removed through the incisions or removed through the vagina. RISKS AND COMPLICATIONS  Possible complications associated with this procedure include:  Bleeding and risk of blood transfusion. Tell your health care provider if you do not want to receive any blood products.  Blood clots in the legs or lung.  Infection.  Injury to surrounding organs.  Problems or side effects related to anesthesia.  Conversion to an abdominal hysterectomy from one of the other techniques. WHAT TO EXPECT AFTER A HYSTERECTOMY  You will be given pain medicine.  You will need to have someone with you for the first 3-5 days after you go  home.  You will need to follow up with your surgeon in 2-4 weeks after surgery to evaluate your progress.  You may have early menopause symptoms such as hot flashes, night sweats, and insomnia.  If you had a hysterectomy for a problem that was not cancer or not a condition that could lead to cancer, then you no longer need Pap tests. However, even if you no longer need a Pap test, a regular exam is a good idea to make sure no other problems are starting. Document Released: 12/24/2000 Document Revised: 04/20/2013 Document Reviewed: 03/07/2013 [facility] Patient Information 2015 [place], [place]. This information is not intended to replace advice given to you by your health care provider. Make sure you discuss any questions you have with your health care provider.

## 2014-04-19 NOTE — H&P (Signed)
  [facility] Visit   Patient name: Samantha Schroeder MRN [id number] Date of birth: January 23, 1971  CC & HPI:   Samantha Schroeder is a 43 y.o. female presenting today to discuss surgery one more time. She would like to have the surgery within the next couple of months. She las lost 15 pounds since March 2015.Pt states that since her last visit she has found out that her maternal great aunt had ovarian cancer.  ROS:   All systems are reviewed and negative unless otherwise specified in the HPI.  Pertinent History Reviewed:   Reviewed:  Medical  Past Medical History   Diagnosis  Date   .  Migraine    Surgical Hx:  Past Surgical History   Procedure  Laterality  Date   .  Cholecystectomy     .  Breast surgery       reduction   .  Wisdom tooth extraction     Medications: Reviewed & Updated - see associated section  Current outpatient prescriptions:Loratadine (CLARITIN PO), Take by mouth as needed., Disp: , Rfl: ; SUMAtriptan (IMITREX) 50 MG tablet, Take 50 mg by mouth every 2 (two) hours as needed for migraine or headache. May repeat in 2 hours if headache persists or recurs., Disp: , Rfl: ; naproxen (NAPROSYN) 500 MG tablet, Take 1 tablet (500 mg total) by mouth 2 (two) times daily with a meal., Disp: 30 tablet, Rfl: 0  oxyCODONE-acetaminophen (PERCOCET) 5-325 MG per tablet, Take 1 tablet by mouth every 4 (four) hours as needed for pain., Disp: 20 tablet, Rfl: 0; promethazine (PHENERGAN) 25 MG tablet, Take 1 tablet (25 mg total) by mouth every 6 (six) hours as needed for nausea., Disp: 12 tablet, Rfl: 0; tamsulosin (FLOMAX) 0.4 MG CAPS capsule, Take 1 capsule (0.4 mg total) by mouth 2 (two) times daily., Disp: 10 capsule, Rfl: 0  Social History: Reviewed - reports that she has never smoked. She has never used smokeless tobacco.  Objective Findings:   Vitals: Blood pressure 142/88, height 5\' 4"  (1.626 m), weight 166 lb (75.297 kg), last menstrual period 03/05/2014.  Physical Examination: Lengthy  discussion concerning surgery.  Assessment & Plan:   A:  1. Adenomyosis,  2 Family history of ovarian cancer  3.Desire for salpingectomy  4.dysmenorrhea.  5. Desire for tummy tuck at hysterectomy.   Plan: Abdominal hysterectomy, bilateral salpingectomy, panniculectomy. Pt has been counselled at length over procedure, each component, with pro's and con's of ovarian preservation at her young age discussed in detail, pt decided on tummy tuck with removal of tubes, ovarian preservaton, and removal of uterus and cervix as well as fallopian tubes.

## 2014-04-20 ENCOUNTER — Encounter (HOSPITAL_COMMUNITY): Payer: Self-pay

## 2014-04-20 ENCOUNTER — Encounter (HOSPITAL_COMMUNITY)
Admission: RE | Admit: 2014-04-20 | Discharge: 2014-04-20 | Disposition: A | Discharge status: Home / Self Care | Payer: BC Managed Care – PPO | Source: Ambulatory Visit | Attending: Obstetrics and Gynecology | Admitting: Obstetrics and Gynecology

## 2014-04-20 DIAGNOSIS — Z0000 Encounter for general adult medical examination without abnormal findings: Secondary | ICD-10-CM | POA: Insufficient documentation

## 2014-04-20 DIAGNOSIS — N854 Malposition of uterus: Secondary | ICD-10-CM | POA: Diagnosis not present

## 2014-04-20 DIAGNOSIS — N946 Dysmenorrhea, unspecified: Secondary | ICD-10-CM | POA: Diagnosis not present

## 2014-04-20 DIAGNOSIS — N941 Dyspareunia: Secondary | ICD-10-CM | POA: Diagnosis not present

## 2014-04-20 DIAGNOSIS — N800 Endometriosis of uterus: Secondary | ICD-10-CM | POA: Insufficient documentation

## 2014-04-20 LAB — URINALYSIS, ROUTINE W REFLEX MICROSCOPIC
Bilirubin Urine: NEGATIVE
Glucose, UA: NEGATIVE mg/dL
Ketones, ur: NEGATIVE mg/dL
Leukocytes, UA: NEGATIVE
Nitrite: NEGATIVE
Protein, ur: NEGATIVE mg/dL
Specific Gravity, Urine: >1.03 — ABNORMAL HIGH (ref 1.005–1.030)
Urobilinogen, UA: 0.2 mg/dL (ref 0.0–1.0)
pH: 5 (ref 5.0–8.0)

## 2014-04-20 LAB — BASIC METABOLIC PANEL
Anion gap: 12 (ref 5–15)
BUN: 18 mg/dL (ref 6–23)
CO2: 28 mEq/L (ref 19–32)
Calcium: 9.7 mg/dL (ref 8.4–10.5)
Chloride: 103 mEq/L (ref 96–112)
Creatinine, Ser: 0.85 mg/dL (ref 0.50–1.10)
GFR calc Af Amer: >90 mL/min (ref >90)
GFR calc non Af Amer: 84 mL/min — ABNORMAL LOW (ref >90)
Glucose, Bld: 102 mg/dL — ABNORMAL HIGH (ref 70–99)
Potassium: 3.5 mEq/L — ABNORMAL LOW (ref 3.7–5.3)
Sodium: 143 mEq/L (ref 137–147)

## 2014-04-20 LAB — CBC
HCT: 37.7 % (ref 36.0–46.0)
Hemoglobin: 12.6 g/dL (ref 12.0–15.0)
MCH: 29.5 pg (ref 26.0–34.0)
MCHC: 33.4 g/dL (ref 30.0–36.0)
MCV: 88.3 fL (ref 78.0–100.0)
Platelets: 209 10*3/uL (ref 150–400)
RBC: 4.27 MIL/uL (ref 3.87–5.11)
RDW: 12.5 % (ref 11.5–15.5)
WBC: 8.1 10*3/uL (ref 4.0–10.5)

## 2014-04-20 LAB — URINE MICROSCOPIC-ADD ON

## 2014-04-20 LAB — TYPE AND SCREEN
ABO/RH(D): B POS
Antibody Screen: NEGATIVE

## 2014-04-20 LAB — HCG, QUANTITATIVE, PREGNANCY: hCG, Beta Chain, Quant, S: 1 m[IU]/mL (ref <5)

## 2014-04-20 LAB — SURGICAL PCR SCREEN
MRSA, PCR: NEGATIVE
Staphylococcus aureus: POSITIVE — AB

## 2014-04-20 NOTE — Pre-Procedure Instructions (Signed)
Patient given information to sign up fpr my chart at home. 

## 2014-04-24 NOTE — OR Nursing (Signed)
Potassium  3.5 reported to Dr. Jayme CloudGonzalez.  no further orders.

## 2014-04-25 ENCOUNTER — Encounter (HOSPITAL_COMMUNITY)
Admission: RE | Disposition: A | Discharge status: Home / Self Care | Payer: Self-pay | Source: Ambulatory Visit | Attending: Obstetrics and Gynecology

## 2014-04-25 ENCOUNTER — Encounter (HOSPITAL_COMMUNITY): Payer: Self-pay | Admitting: Anesthesiology

## 2014-04-25 ENCOUNTER — Encounter (HOSPITAL_COMMUNITY): Payer: BC Managed Care – PPO | Admitting: Anesthesiology

## 2014-04-25 ENCOUNTER — Inpatient Hospital Stay (HOSPITAL_COMMUNITY): Payer: BC Managed Care – PPO | Admitting: Anesthesiology

## 2014-04-25 ENCOUNTER — Inpatient Hospital Stay (HOSPITAL_COMMUNITY)
Admission: RE | Admit: 2014-04-25 | Discharge: 2014-04-27 | DRG: 743 | Disposition: A | Discharge status: Home / Self Care | Payer: BC Managed Care – PPO | Source: Ambulatory Visit | Attending: Obstetrics and Gynecology | Admitting: Obstetrics and Gynecology

## 2014-04-25 DIAGNOSIS — L574 Cutis laxa senilis: Secondary | ICD-10-CM

## 2014-04-25 DIAGNOSIS — N8500 Endometrial hyperplasia, unspecified: Secondary | ICD-10-CM

## 2014-04-25 DIAGNOSIS — N946 Dysmenorrhea, unspecified: Secondary | ICD-10-CM | POA: Diagnosis present

## 2014-04-25 DIAGNOSIS — Z90710 Acquired absence of both cervix and uterus: Secondary | ICD-10-CM | POA: Diagnosis present

## 2014-04-25 DIAGNOSIS — N800 Endometriosis of uterus: Principal | ICD-10-CM | POA: Diagnosis present

## 2014-04-25 DIAGNOSIS — N854 Malposition of uterus: Secondary | ICD-10-CM

## 2014-04-25 DIAGNOSIS — N736 Female pelvic peritoneal adhesions (postinfective): Secondary | ICD-10-CM | POA: Diagnosis present

## 2014-04-25 HISTORY — PX: ABDOMINAL HYSTERECTOMY: SHX81

## 2014-04-25 HISTORY — PX: PANNICULECTOMY: SHX5360

## 2014-04-25 SURGERY — HYSTERECTOMY, ABDOMINAL
Anesthesia: General

## 2014-04-25 MED ORDER — DEXAMETHASONE SODIUM PHOSPHATE 4 MG/ML IJ SOLN
INTRAMUSCULAR | Status: AC
  Filled 2014-04-25: qty 1

## 2014-04-25 MED ORDER — GLYCOPYRROLATE 0.2 MG/ML IJ SOLN
INTRAMUSCULAR | Status: AC
  Filled 2014-04-25: qty 2

## 2014-04-25 MED ORDER — DIPHENHYDRAMINE HCL 50 MG/ML IJ SOLN
12.5000 mg | Freq: Four times a day (QID) | INTRAMUSCULAR | Status: DC | PRN
  Administered 2014-04-25 (×2): 12.5 mg via INTRAVENOUS
  Filled 2014-04-25 (×2): qty 1

## 2014-04-25 MED ORDER — BUPIVACAINE LIPOSOME 1.3 % IJ SUSP
INTRAMUSCULAR | Status: AC
  Filled 2014-04-25: qty 20

## 2014-04-25 MED ORDER — CEFAZOLIN SODIUM-DEXTROSE 2-3 GM-% IV SOLR
2.0000 g | INTRAVENOUS | Status: AC
  Administered 2014-04-25: 2 g via INTRAVENOUS
  Filled 2014-04-25: qty 50

## 2014-04-25 MED ORDER — ONDANSETRON HCL 4 MG/2ML IJ SOLN: 4.0000 mg | Freq: Four times a day (QID) | INTRAMUSCULAR | Status: DC | PRN

## 2014-04-25 MED ORDER — ROCURONIUM BROMIDE 50 MG/5ML IV SOLN
INTRAVENOUS | Status: AC
  Filled 2014-04-25: qty 1

## 2014-04-25 MED ORDER — LIDOCAINE HCL (PF) 1 % IJ SOLN
INTRAMUSCULAR | Status: AC
  Filled 2014-04-25: qty 5

## 2014-04-25 MED ORDER — LIDOCAINE HCL (CARDIAC) 20 MG/ML IV SOLN
INTRAVENOUS | Status: DC | PRN
  Administered 2014-04-25: 10 mg via INTRAVENOUS
  Administered 2014-04-25: 25 mg via INTRAVENOUS

## 2014-04-25 MED ORDER — MIDAZOLAM HCL 2 MG/2ML IJ SOLN
INTRAMUSCULAR | Status: AC
  Filled 2014-04-25: qty 2

## 2014-04-25 MED ORDER — SODIUM CHLORIDE 0.9 % IV SOLN
INTRAVENOUS | Status: DC
  Administered 2014-04-25 – 2014-04-26 (×4): via INTRAVENOUS

## 2014-04-25 MED ORDER — DOCUSATE SODIUM 100 MG PO CAPS
100.0000 mg | ORAL_CAPSULE | Freq: Two times a day (BID) | ORAL | Status: DC
  Administered 2014-04-26 – 2014-04-27 (×3): 100 mg via ORAL
  Filled 2014-04-25 (×3): qty 1

## 2014-04-25 MED ORDER — NEOSTIGMINE METHYLSULFATE 10 MG/10ML IV SOLN
INTRAVENOUS | Status: DC | PRN
  Administered 2014-04-25: 3 mg via INTRAVENOUS

## 2014-04-25 MED ORDER — FENTANYL CITRATE 0.05 MG/ML IJ SOLN
INTRAMUSCULAR | Status: AC
  Filled 2014-04-25: qty 2

## 2014-04-25 MED ORDER — KETOROLAC TROMETHAMINE 30 MG/ML IJ SOLN: 30.0000 mg | Freq: Once | INTRAMUSCULAR | Status: DC

## 2014-04-25 MED ORDER — FENTANYL CITRATE 0.05 MG/ML IJ SOLN
INTRAMUSCULAR | Status: AC
  Filled 2014-04-25: qty 5

## 2014-04-25 MED ORDER — DIPHENHYDRAMINE HCL 12.5 MG/5ML PO ELIX: 12.5000 mg | ORAL_SOLUTION | Freq: Four times a day (QID) | ORAL | Status: DC | PRN

## 2014-04-25 MED ORDER — IBUPROFEN 600 MG PO TABS
600.0000 mg | ORAL_TABLET | Freq: Four times a day (QID) | ORAL | Status: DC | PRN
  Administered 2014-04-26 (×2): 600 mg via ORAL
  Filled 2014-04-25 (×2): qty 1

## 2014-04-25 MED ORDER — PANTOPRAZOLE SODIUM 40 MG PO TBEC
40.0000 mg | DELAYED_RELEASE_TABLET | Freq: Every day | ORAL | Status: DC
  Administered 2014-04-26 – 2014-04-27 (×2): 40 mg via ORAL
  Filled 2014-04-25 (×2): qty 1

## 2014-04-25 MED ORDER — FENTANYL CITRATE 0.05 MG/ML IJ SOLN
INTRAMUSCULAR | Status: DC | PRN
  Administered 2014-04-25 (×11): 50 ug via INTRAVENOUS

## 2014-04-25 MED ORDER — KETOROLAC TROMETHAMINE 30 MG/ML IJ SOLN
30.0000 mg | Freq: Four times a day (QID) | INTRAMUSCULAR | Status: DC
  Filled 2014-04-25: qty 1

## 2014-04-25 MED ORDER — ROCURONIUM BROMIDE 100 MG/10ML IV SOLN
INTRAVENOUS | Status: DC | PRN
  Administered 2014-04-25: 35 mg via INTRAVENOUS
  Administered 2014-04-25: 5 mg via INTRAVENOUS
  Administered 2014-04-25: 15 mg via INTRAVENOUS
  Administered 2014-04-25: 10 mg via INTRAVENOUS

## 2014-04-25 MED ORDER — KETOROLAC TROMETHAMINE 30 MG/ML IJ SOLN
30.0000 mg | Freq: Four times a day (QID) | INTRAMUSCULAR | Status: DC
  Administered 2014-04-25 – 2014-04-27 (×7): 30 mg via INTRAVENOUS
  Filled 2014-04-25 (×8): qty 1

## 2014-04-25 MED ORDER — PROPOFOL 10 MG/ML IV BOLUS
INTRAVENOUS | Status: DC | PRN
  Administered 2014-04-25: 20 mg via INTRAVENOUS
  Administered 2014-04-25: 150 mg via INTRAVENOUS

## 2014-04-25 MED ORDER — HYDROMORPHONE 0.3 MG/ML IV SOLN
INTRAVENOUS | Status: AC
  Filled 2014-04-25: qty 25

## 2014-04-25 MED ORDER — SUCCINYLCHOLINE CHLORIDE 20 MG/ML IJ SOLN
INTRAMUSCULAR | Status: AC
  Filled 2014-04-25: qty 1

## 2014-04-25 MED ORDER — MIDAZOLAM HCL 2 MG/2ML IJ SOLN
1.0000 mg | INTRAMUSCULAR | Status: DC | PRN
  Administered 2014-04-25: 2 mg via INTRAVENOUS

## 2014-04-25 MED ORDER — NALOXONE HCL 0.4 MG/ML IJ SOLN: 0.4000 mg | INTRAMUSCULAR | Status: DC | PRN

## 2014-04-25 MED ORDER — PROPOFOL 10 MG/ML IV EMUL
INTRAVENOUS | Status: AC
  Filled 2014-04-25: qty 20

## 2014-04-25 MED ORDER — HYDROMORPHONE 0.3 MG/ML IV SOLN
INTRAVENOUS | Status: DC
  Administered 2014-04-25: 2.7 mg via INTRAVENOUS
  Administered 2014-04-25: 0.9 mg via INTRAVENOUS
  Administered 2014-04-25: 11:00:00 via INTRAVENOUS
  Administered 2014-04-26: 1.5 mg via INTRAVENOUS
  Administered 2014-04-26: 0.6 mg via INTRAVENOUS
  Administered 2014-04-26: 03:00:00 via INTRAVENOUS
  Filled 2014-04-25: qty 25

## 2014-04-25 MED ORDER — ONDANSETRON HCL 4 MG/2ML IJ SOLN
4.0000 mg | Freq: Once | INTRAMUSCULAR | Status: AC
  Administered 2014-04-25: 4 mg via INTRAVENOUS

## 2014-04-25 MED ORDER — BUPIVACAINE LIPOSOME 1.3 % IJ SUSP
INTRAMUSCULAR | Status: DC | PRN
  Administered 2014-04-25: 20 mL

## 2014-04-25 MED ORDER — NEOSTIGMINE METHYLSULFATE 10 MG/10ML IV SOLN
INTRAVENOUS | Status: AC
  Filled 2014-04-25: qty 1

## 2014-04-25 MED ORDER — ONDANSETRON HCL 4 MG/2ML IJ SOLN: 4.0000 mg | Freq: Once | INTRAMUSCULAR | Status: DC | PRN

## 2014-04-25 MED ORDER — SODIUM CHLORIDE 0.9 % IJ SOLN: 9.0000 mL | INTRAMUSCULAR | Status: DC | PRN

## 2014-04-25 MED ORDER — DEXAMETHASONE SODIUM PHOSPHATE 4 MG/ML IJ SOLN
4.0000 mg | Freq: Once | INTRAMUSCULAR | Status: AC
  Administered 2014-04-25: 4 mg via INTRAVENOUS

## 2014-04-25 MED ORDER — 0.9 % SODIUM CHLORIDE (POUR BTL) OPTIME
TOPICAL | Status: DC | PRN
  Administered 2014-04-25 (×3): 1000 mL

## 2014-04-25 MED ORDER — LACTATED RINGERS IV SOLN
INTRAVENOUS | Status: DC
  Administered 2014-04-25: 08:00:00 via INTRAVENOUS
  Administered 2014-04-25: 1000 mL via INTRAVENOUS
  Administered 2014-04-25: 10:00:00 via INTRAVENOUS

## 2014-04-25 MED ORDER — GLYCOPYRROLATE 0.2 MG/ML IJ SOLN
INTRAMUSCULAR | Status: DC | PRN
  Administered 2014-04-25: .5 mg via INTRAVENOUS

## 2014-04-25 MED ORDER — ONDANSETRON HCL 4 MG PO TABS: 4.0000 mg | ORAL_TABLET | Freq: Four times a day (QID) | ORAL | Status: DC | PRN

## 2014-04-25 MED ORDER — ONDANSETRON HCL 4 MG/2ML IJ SOLN
INTRAMUSCULAR | Status: AC
  Filled 2014-04-25: qty 2

## 2014-04-25 MED ORDER — SODIUM CHLORIDE 0.9 % IJ SOLN
INTRAMUSCULAR | Status: AC
  Filled 2014-04-25: qty 20

## 2014-04-25 MED ORDER — FENTANYL CITRATE 0.05 MG/ML IJ SOLN
25.0000 ug | INTRAMUSCULAR | Status: DC | PRN
  Administered 2014-04-25 (×4): 50 ug via INTRAVENOUS

## 2014-04-25 MED ORDER — ONDANSETRON HCL 4 MG/2ML IJ SOLN
4.0000 mg | Freq: Four times a day (QID) | INTRAMUSCULAR | Status: DC | PRN
  Administered 2014-04-25: 4 mg via INTRAVENOUS
  Filled 2014-04-25: qty 2

## 2014-04-25 MED ORDER — OXYCODONE-ACETAMINOPHEN 5-325 MG PO TABS
1.0000 | ORAL_TABLET | ORAL | Status: DC | PRN
  Administered 2014-04-26: 1 via ORAL
  Administered 2014-04-26: 2 via ORAL
  Administered 2014-04-27 (×2): 1 via ORAL
  Filled 2014-04-25: qty 1
  Filled 2014-04-25 (×2): qty 2
  Filled 2014-04-25: qty 1

## 2014-04-25 SURGICAL SUPPLY — 52 items
APL SKNCLS STERI-STRIP NONHPOA (GAUZE/BANDAGES/DRESSINGS) ×2
BAG HAMPER (MISCELLANEOUS) ×3 IMPLANT
BENZOIN TINCTURE PRP APPL 2/3 (GAUZE/BANDAGES/DRESSINGS) ×2 IMPLANT
BLADE SURG SZ10 CARB STEEL (BLADE) ×4 IMPLANT
CELLS DAT CNTRL 66122 CELL SVR (MISCELLANEOUS) ×2 IMPLANT
CLOTH BEACON ORANGE TIMEOUT ST (SAFETY) ×3 IMPLANT
COVER LIGHT HANDLE STERIS (MISCELLANEOUS) ×6 IMPLANT
DRSG OPSITE POSTOP 4X10 (GAUZE/BANDAGES/DRESSINGS) ×4 IMPLANT
DURAPREP 26ML APPLICATOR (WOUND CARE) ×3 IMPLANT
ELECT REM PT RETURN 9FT ADLT (ELECTROSURGICAL) ×3
ELECTRODE REM PT RTRN 9FT ADLT (ELECTROSURGICAL) ×2 IMPLANT
EVACUATOR DRAINAGE 10X20 100CC (DRAIN) ×1 IMPLANT
EVACUATOR SILICONE 100CC (DRAIN) ×3
FORMALIN 10 PREFIL 480ML (MISCELLANEOUS) ×3 IMPLANT
GAUZE SPONGE 4X4 12PLY STRL (GAUZE/BANDAGES/DRESSINGS) ×3 IMPLANT
GLOVE BIOGEL M 7.0 STRL (GLOVE) ×2 IMPLANT
GLOVE BIOGEL PI IND STRL 7.0 (GLOVE) ×3 IMPLANT
GLOVE BIOGEL PI IND STRL 9 (GLOVE) ×3 IMPLANT
GLOVE BIOGEL PI INDICATOR 7.0 (GLOVE) ×3
GLOVE BIOGEL PI INDICATOR 9 (GLOVE) ×2
GLOVE ECLIPSE 9.0 STRL (GLOVE) ×5 IMPLANT
GLOVE SS BIOGEL STRL SZ 6.5 (GLOVE) ×1 IMPLANT
GLOVE SUPERSENSE BIOGEL SZ 6.5 (GLOVE) ×1
GOWN SPEC L3 XXLG W/TWL (GOWN DISPOSABLE) ×6 IMPLANT
GOWN STRL REUS W/TWL LRG LVL3 (GOWN DISPOSABLE) ×6 IMPLANT
INST SET MAJOR GENERAL (KITS) ×3 IMPLANT
KIT ROOM TURNOVER APOR (KITS) ×3 IMPLANT
MANIFOLD NEPTUNE II (INSTRUMENTS) ×3 IMPLANT
NDL HYPO 18GX1.5 BLUNT FILL (NEEDLE) ×1 IMPLANT
NDL HYPO 25X1 1.5 SAFETY (NEEDLE) ×1 IMPLANT
NEEDLE HYPO 18GX1.5 BLUNT FILL (NEEDLE) ×3 IMPLANT
NEEDLE HYPO 25X1 1.5 SAFETY (NEEDLE) ×3 IMPLANT
NS IRRIG 1000ML POUR BTL (IV SOLUTION) ×8 IMPLANT
PACK ABDOMINAL MAJOR (CUSTOM PROCEDURE TRAY) ×3 IMPLANT
PAD ARMBOARD 7.5X6 YLW CONV (MISCELLANEOUS) ×3 IMPLANT
RETRACTOR WND ALEXIS 18 MED (MISCELLANEOUS) IMPLANT
RTRCTR WOUND ALEXIS 18CM MED (MISCELLANEOUS) ×3
SET BASIN LINEN APH (SET/KITS/TRAYS/PACK) ×3 IMPLANT
SPONGE DRAIN TRACH 4X4 STRL 2S (GAUZE/BANDAGES/DRESSINGS) ×2 IMPLANT
STRIP CLOSURE SKIN 1/2X4 (GAUZE/BANDAGES/DRESSINGS) ×6 IMPLANT
SUT CHROMIC 0 CT 1 (SUTURE) ×36 IMPLANT
SUT CHROMIC 2 0 CT 1 (SUTURE) ×7 IMPLANT
SUT ETHILON 3 0 FSL (SUTURE) ×2 IMPLANT
SUT PDS AB CT VIOLET #0 27IN (SUTURE) ×2 IMPLANT
SUT PLAIN 2 0 XLH (SUTURE) ×2 IMPLANT
SUT PLAIN CT 1/2CIR 2-0 27IN (SUTURE) ×9 IMPLANT
SUT VIC AB 0 CT1 27 (SUTURE) ×3
SUT VIC AB 0 CT1 27XBRD ANTBC (SUTURE) ×1 IMPLANT
SUT VICRYL 4 0 KS 27 (SUTURE) ×5 IMPLANT
SYR 20CC LL (SYRINGE) ×3 IMPLANT
TOWEL BLUE STERILE X RAY DET (MISCELLANEOUS) ×3 IMPLANT
TRAY FOLEY CATH 16FR SILVER (SET/KITS/TRAYS/PACK) ×3 IMPLANT

## 2014-04-25 NOTE — Brief Op Note (Signed)
04/25/2014  10:18 AM  PATIENT:  Samantha LightSarah H Lyday  43 y.o. female  PRE-OPERATIVE DIAGNOSIS:  Dysmenorrhea, Adenomyosis, uterine retroversion  POST-OPERATIVE DIAGNOSIS:  Dysmenorrhea, Adenomyosis,   ,uterine retroversion, Pelvic Adhesions   PROCEDURE:  Procedure(s): HYSTERECTOMY ABDOMINAL (N/A) PANNICULECTOMY (N/A)  SURGEON:  Surgeon(s) and Role:    * Tilda BurrowJohn Gino Garrabrant V, MD - Primary  PHYSICIAN ASSISTANT:   ASSISTANTS: Noe Gensatherine Page, RN FA   ANESTHESIA:   local and general  EBL:  Total I/O In: 2000 [I.V.:2000] Out: 325 [Urine:175; Blood:150]  BLOOD ADMINISTERED:none  DRAINS: (Single) Jackson-Pratt drain(s) with closed bulb suction in the Subcutaneous space   LOCAL MEDICATIONS USED:  OTHER Exparel 20 cc  SPECIMEN:  Source of Specimen:  Uterus and cervix, skin and fatty tissue discarded  DISPOSITION OF SPECIMEN:  PATHOLOGY  COUNTS:  YES  TOURNIQUET:  * No tourniquets in log *  DICTATION: .Dragon Dictation  PLAN OF CARE: Admit to inpatient   PATIENT DISPOSITION:  PACU - hemodynamically stable.   Delay start of Pharmacological VTE agent (>24hrs) due to surgical blood loss or risk of bleeding: not applicable

## 2014-04-25 NOTE — Anesthesia Preprocedure Evaluation (Signed)
Anesthesia Evaluation  Patient identified by MRN, date of birth, ID band Patient awake    Reviewed: Allergy & Precautions, H&P , NPO status , Patient's Chart, lab work & pertinent test results  Airway Mallampati: I TM Distance: >3 FB     Dental  (+) Teeth Intact   Pulmonary  breath sounds clear to auscultation        Cardiovascular negative cardio ROS  Rhythm:Regular Rate:Normal     Neuro/Psych  Headaches,    GI/Hepatic negative GI ROS,   Endo/Other    Renal/GU      Musculoskeletal   Abdominal   Peds  Hematology   Anesthesia Other Findings   Reproductive/Obstetrics                           Anesthesia Physical Anesthesia Plan  ASA: II  Anesthesia Plan: General   Post-op Pain Management:    Induction: Intravenous  Airway Management Planned: Oral ETT  Additional Equipment:   Intra-op Plan:   Post-operative Plan: Extubation in OR  Informed Consent: I have reviewed the patients History and Physical, chart, labs and discussed the procedure including the risks, benefits and alternatives for the proposed anesthesia with the patient or authorized representative who has indicated his/her understanding and acceptance.     Plan Discussed with:   Anesthesia Plan Comments:         Anesthesia Quick Evaluation

## 2014-04-25 NOTE — Op Note (Signed)
04/25/2014  10:18 AM  PATIENT:  Samantha Schroeder  43 y.o. female  PRE-OPERATIVE DIAGNOSIS:  Dysmenorrhea, Adenomyosis, uterine retroversion  POST-OPERATIVE DIAGNOSIS:  Dysmenorrhea, Adenomyosis,   ,uterine retroversion, Pelvic Adhesions   PROCEDURE:  Procedure(s): HYSTERECTOMY ABDOMINAL (N/A) PANNICULECTOMY (N/A)  SURGEON:  Surgeon(s) and Role:    * Tilda BurrowJohn Ravindra Baranek V, MD - Primary  PHYSICIAN ASSISTANT:   ASSISTANTS: Noe Gensatherine Page, RN FA   ANESTHESIA:   local and general  EBL:  Total I/O In: 2000 [I.V.:2000] Out: 325 [Urine:175; Blood:150]  BLOOD ADMINISTERED:none  DRAINS: (Single) Jackson-Pratt drain(s) with closed bulb suction in the Subcutaneous space   LOCAL MEDICATIONS USED:  OTHER Exparel 20 cc  SPECIMEN:  Source of Specimen:  Uterus and cervix, skin and fatty tissue discarded  DISPOSITION OF SPECIMEN:  PATHOLOGY  COUNTS:  YES  TOURNIQUET:  * No tourniquets in log *  DICTATION: .Dragon Dictation  PLAN OF CARE: Admit to inpatient   PATIENT DISPOSITION:  PACU - hemodynamically stable.   Delay start of Pharmacological VTE agent (>24hrs) due to surgical blood loss or risk of bleeding: not applicable   Details of procedure: Patient was taken operating room prepped and draped for abdominal procedure with Foley catheter inserted vaginal prepping performed, and abdominal prepping performed, timeout conducted and procedure confirmed by surgical team. Ancef 2 g intravenously was administered per preoperative protocol. Transverse lower abdominal incision previously marked prior to leaving the  preop area, was sharply excised lungs inferior border, a 45 cm incision from lateral to the anterior superior iliac crest on each side in a park to the opposite side and under my number removing skin and a thin layer of fatty tissue leaving a uniform layer of fat well vascularized over the fascia. Point cautery was used as necessary and good hemostasis obtained. Incision was lightly  irrigated, and the lateral one third of the incision on the side then reapproximated in the subcutaneous fatty tissue with a series of interrupted horizontal mattress sutures of 2-0 plain, then subcuticular 4-0 Vicryl was used to close the skin over the lateral third of the incision. A 10 incision temporally was placed of 0 chromic to protect the edge of the approximated incision. The midportion of the incision was opened with a Pelosi incision of 12 cm length vertically, with digital blunt entry of the peritoneal cavity careful sharp dissection of the peritoneum and inspection the pelvis. There was a small bit of omental adhesions to the top of the uterus which were easily dissected free. The uterus is retroverted and adherent to both sigmoid and adnexal structures., And the anterior surface of the uterus had a smooth but stretchy peritoneal surface suggestive of prior inflammatory process. Kelly clamps can be placed at each round ligament insertion, and the sigmoid colon and epiploic fat to attach the lateral sidewall into the freed up sharply. This time the bowel could be elevated and packed away with using laparotomy tapes. The left adnexal adhesions were dissected with a combination of sharp and blunt dissection. Round ligaments were doubly ligated with 0 chromic, transected, and a window through the broad ligament developed. The left utero-ovarian ligament was then clamped and doubly, and transected and suture ligated. The ovary was lightly adherent still to the sidewall. This was left for future inspection. At no time was the retroperitoneum entered. Uterine vessels were skeletonized, crossclamped with Heaney clamp transected, and suture ligated. Upper cardinal ligaments were similarly grasped with straight Heaney clamp transected and suture ligated with 0 chromic. This was  performed bilaterally. On the right side the ovary was somewhat atrophic, closely attached to the uterus and a small portion the ovary  was sacrificed during the clamping and transection process fallopian tubes were grossly normal. Due to the significance of the adhesions was felt that salpingectomy was likely to increase adhesions sufficiently that preservation of the tubes was considered appropriate. The fimbria were visible bilaterally. There was no hydrosalpinx. Lower cardinal ligaments were similarly clamped, with Heaney clamp transected with knife dissection and suture ligated and tagged bilaterally. A stab incision with a #10 blade was made into the anterior cervicovaginal fornix and the cervix amputated off the cuff. The cuff was quite thickened, and we stayed closely adherent to the cervix during this dissection process. Coker clamps were placed lateral vaginal angles as well as posteriorly and anterior. Aldridge stitches were placed at each lateral vaginal angle incorporating the lower cardinal ligaments into the cuff support. Posteriorly the very long wide vaginal cuff was reduced in transverse diameter for placement of 2 figure-of-eight suture Side to side while pulling the center of the posterior vaginal cuff toward the sacral promontory. This reduced the lateral transverse vaginal cuff diameter. The remaining cuff was then closed with a series of interrupted 0 chromic sutures front to back good hemostasis was achieved at the end of the case. Pelvis was irrigated. Pedicles were inspected. Again the decision was made to leave the tubes and the left adnexa showed that the chromic suture across the utero-ovarian pedicle had slipped off. Again this required 3 additional small sutures of 2-0 chromic to reapproximate and in close this pedicle. Irrigation was again performed and we watched the cuff and the pedicles for hemostasis, no further bleeding was noted of significance. The bladder flap was loosely attached with 2 interrupted sutures over the cuff, and laparotomy equipment removed. Sponge and needle counts were correct. Anterior  peritoneum was closed using running 2-0 chromic. The fascia was closed with running 0 PDS.   Subcutaneous tissues were then irrigated again,, in the center portion of the the subcutaneous fatty tissue closed with additional 20 plain mattress sutures. Subcutaneous 10 mm flat JP drain was then placed in the depth of the subcutaneous fatty tissue and allowed to exit through a separate stab incision in the left lower quadrant, and this was sewn in place with Prolene The subcuticular 4-0 Vicryl closure of the middle portion of the skin incision followed. Patient tolerated procedure well with recovery room in stable condition. Exparel was injected into the fatty tissue around the incision prior to subcuticular 4-0 Vicryl closure. Sponge and needle counts were correct at end of procedure as well estimated blood loss 150 cc, urine output 175 cc and fluids given 2300 cc crystalloid

## 2014-04-25 NOTE — Anesthesia Procedure Notes (Signed)
Procedure Name: Intubation Date/Time: 04/25/2014 7:41 AM Performed by: Glynn OctaveANIEL, Evy Lutterman E Pre-anesthesia Checklist: Patient identified, Patient being monitored, Timeout performed, Emergency Drugs available and Suction available Patient Re-evaluated:Patient Re-evaluated prior to inductionOxygen Delivery Method: Circle System Utilized Preoxygenation: Pre-oxygenation with 100% oxygen Intubation Type: IV induction Ventilation: Mask ventilation without difficulty Laryngoscope Size: Mac and 3 Grade View: Grade I Tube type: Oral Tube size: 7.0 mm Number of attempts: 1 Airway Equipment and Method: stylet Placement Confirmation: ETT inserted through vocal cords under direct vision,  positive ETCO2 and breath sounds checked- equal and bilateral Secured at: 21 cm Tube secured with: Tape Dental Injury: Teeth and Oropharynx as per pre-operative assessment

## 2014-04-25 NOTE — Anesthesia Postprocedure Evaluation (Signed)
  Anesthesia Post-op Note  Patient: Samantha Schroeder  Procedure(s) Performed: Procedure(s): HYSTERECTOMY ABDOMINAL (N/A) PANNICULECTOMY (N/A)  Patient Location: PACU  Anesthesia Type:General  Level of Consciousness: awake and alert   Airway and Oxygen Therapy: Patient Spontanous Breathing and Patient connected to face mask oxygen  Post-op Pain: mild  Post-op Assessment: Post-op Vital signs reviewed, Patient's Cardiovascular Status Stable, Respiratory Function Stable, Patent Airway and No signs of Nausea or vomiting  Post-op Vital Signs: Reviewed and stable  Last Vitals:  Filed Vitals:   04/25/14 0725  BP: 119/79  Pulse:   Temp:   Resp: 66    Complications: No apparent anesthesia complications

## 2014-04-25 NOTE — H&P (Signed)
    Patient name: Samantha Schroeder MRN [id number] Date of birth: 1971-02-26  CC & HPI:   Samantha Schroeder is a 43 y.o. female presenting today for an abdominal hysterectomy, bilateral salpingectomy, and panniculectomy for the diagnosis of Adenomyosis, dysmenorrhea. ROS:   +Dysmenorrhea  No other complaints  Pertinent History Reviewed:   Reviewed: Significant for  Medical  Past Medical History   Diagnosis  Date   .  Migraine    .  Vaginal Pap smear, abnormal  07/2013     ASCUS + High risk HPV   Surgical Hx:  Past Surgical History   Procedure  Laterality  Date   .  Cholecystectomy     .  Breast surgery       reduction   .  Wisdom tooth extraction     Medications: Reviewed & Updated - see associated section  Current outpatient prescriptions:Loratadine (CLARITIN PO), Take by mouth as needed., Disp: , Rfl: ; phentermine 37.5 MG capsule, Take 1 capsule (37.5 mg total) by mouth every morning., Disp: 30 capsule, Rfl: 1; SUMAtriptan (IMITREX) 50 MG tablet, Take 50 mg by mouth every 2 (two) hours as needed for migraine or headache. May repeat in 2 hours if headache persists or recurs., Disp: , Rfl:  Social History: Reviewed - reports that she has never smoked. She has never used smokeless tobacco.  Objective Findings:   Vitals: Blood pressure 110/66, height 5\' 4"  (1.626 m), weight 158 lb (71.668 kg), last menstrual period 03/26/2014.  Physical Examination:  Physical Examination: General appearance - alert, well appearing, and in no distress, oriented to person, place, and time, normal appearing weight and well hydrated Mental status - alert, oriented to person, place, and time, normal mood, behavior, speech, dress, motor activity, and thought processes Eyes - pupils equal and reactive, extraocular eye movements intact Chest - clear to auscultation, no wheezes, rales or rhonchi, symmetric air entry Heart - normal rate and regular rhythm Abdomen - soft, nontender, nondistended, no masses or  organomegaly      Slight abdominal laxity, with previous requested area of skin that pt desires excised on lower abdomen marked off, an elliptical area extending from lateral to anterior superior iliac crest to contralateral iliac crest, with excision of redundant skin planned, up to 10 cm vertical width, as determined intraop.  Pelvic - normal external genitalia, vulva, vagina, cervix, uterus and adnexa,  VAGINA: normal appearing vagina with normal color and discharge, no lesions, 1st degree uterine descensus  CERVIX: normal appearing cervix without discharge or lesions, large cervix, transverse 4cm diameter  UTERUS: uterus is normal size, shape, consistency and nontender, retroverted, 1st degree uterine descensus  Assessment & Plan:   A:  1. Dysmenorrhea  2. Adenomyosis  3. Abdominal wall laxity.  P:  1. Schedule Abdominal Hysterectomy With Bilateral salpingectomy  2. Tummy Tuck sketched out as apart of the visit today

## 2014-04-25 NOTE — Transfer of Care (Signed)
Immediate Anesthesia Transfer of Care Note  Patient: Samantha Schroeder  Procedure(s) Performed: Procedure(s): HYSTERECTOMY ABDOMINAL (N/A) PANNICULECTOMY (N/A)  Patient Location: PACU  Anesthesia Type:General  Level of Consciousness: responds to stimulation  Airway & Oxygen Therapy: Patient Spontanous Breathing and Patient connected to face mask oxygen  Post-op Assessment: Report given to PACU RN  Post vital signs: Reviewed and stable  Complications: No apparent anesthesia complications

## 2014-04-25 NOTE — Progress Notes (Signed)
Patient tolerated clear liquids for lunch and requested a regular diet for dinner.  Will continue to monitor patient.

## 2014-04-26 ENCOUNTER — Encounter (HOSPITAL_COMMUNITY): Payer: Self-pay | Admitting: Obstetrics and Gynecology

## 2014-04-26 LAB — CBC
HCT: 25.2 % — ABNORMAL LOW (ref 36.0–46.0)
Hemoglobin: 8.4 g/dL — ABNORMAL LOW (ref 12.0–15.0)
MCH: 29.9 pg (ref 26.0–34.0)
MCHC: 33.3 g/dL (ref 30.0–36.0)
MCV: 89.7 fL (ref 78.0–100.0)
Platelets: 156 10*3/uL (ref 150–400)
RBC: 2.81 MIL/uL — ABNORMAL LOW (ref 3.87–5.11)
RDW: 12.7 % (ref 11.5–15.5)
WBC: 9.8 10*3/uL (ref 4.0–10.5)

## 2014-04-26 LAB — BASIC METABOLIC PANEL
Anion gap: 9 (ref 5–15)
BUN: 12 mg/dL (ref 6–23)
CO2: 25 mEq/L (ref 19–32)
Calcium: 8.4 mg/dL (ref 8.4–10.5)
Chloride: 106 mEq/L (ref 96–112)
Creatinine, Ser: 0.82 mg/dL (ref 0.50–1.10)
GFR calc Af Amer: >90 mL/min (ref >90)
GFR calc non Af Amer: 88 mL/min — ABNORMAL LOW (ref >90)
Glucose, Bld: 116 mg/dL — ABNORMAL HIGH (ref 70–99)
Potassium: 4.3 mEq/L (ref 3.7–5.3)
Sodium: 140 mEq/L (ref 137–147)

## 2014-04-26 LAB — CBC WITH DIFFERENTIAL/PLATELET
Basophils Absolute: 0 10*3/uL (ref 0.0–0.1)
Basophils Relative: 0 % (ref 0–1)
Eosinophils Absolute: 0 10*3/uL (ref 0.0–0.7)
Eosinophils Relative: 0 % (ref 0–5)
HCT: 26.2 % — ABNORMAL LOW (ref 36.0–46.0)
Hemoglobin: 8.7 g/dL — ABNORMAL LOW (ref 12.0–15.0)
Lymphocytes Relative: 25 % (ref 12–46)
Lymphs Abs: 2.2 10*3/uL (ref 0.7–4.0)
MCH: 29.8 pg (ref 26.0–34.0)
MCHC: 33.2 g/dL (ref 30.0–36.0)
MCV: 89.7 fL (ref 78.0–100.0)
Monocytes Absolute: 0.8 10*3/uL (ref 0.1–1.0)
Monocytes Relative: 9 % (ref 3–12)
Neutro Abs: 5.7 10*3/uL (ref 1.7–7.7)
Neutrophils Relative %: 66 % (ref 43–77)
Platelets: 155 10*3/uL (ref 150–400)
RBC: 2.92 MIL/uL — ABNORMAL LOW (ref 3.87–5.11)
RDW: 12.8 % (ref 11.5–15.5)
WBC: 8.8 10*3/uL (ref 4.0–10.5)

## 2014-04-26 NOTE — Progress Notes (Signed)
Patient's foley removed without any difficulties.  Patient expected to void by 13:30.  Patient encouraged to drink fluids.  Will continue to monitor patient.

## 2014-04-26 NOTE — Progress Notes (Signed)
UR chart review completed.  

## 2014-04-26 NOTE — Progress Notes (Signed)
1 Day Post-Op Procedure(s) (LRB): HYSTERECTOMY ABDOMINAL (N/A) PANNICULECTOMY (N/A)  Subjective: Patient reports mild abdominal pain. no problems voiding.  No nausea or vomiting  Objective: I have reviewed patient's vital signs, intake and output and labs.  General: alert, cooperative, no distress and pale Resp: clear to auscultation bilaterally GI: soft, non-tender; bowel sounds normal; no masses,  no organomegaly, incision: clean, dry, intact and JP drain with minimal clear serous drainage. and no significant distention Vaginal Bleeding: none . CBC Latest Ref Rng 04/26/2014 04/20/2014 03/28/2013  WBC 4.0 - 10.5 K/uL 9.8 8.1 9.2  Hemoglobin 12.0 - 15.0 g/dL 1.6(X8.4(L) 09.612.6 04.514.0  Hematocrit 36.0 - 46.0 % 25.2(L) 37.7 41.5  Platelets 150 - 400 K/uL 156 209 224    BMET    Component Value Date/Time   NA 140 04/26/2014 0601   K 4.3 04/26/2014 0601   CL 106 04/26/2014 0601   CO2 25 04/26/2014 0601   GLUCOSE 116* 04/26/2014 0601   BUN 12 04/26/2014 0601   CREATININE 0.82 04/26/2014 0601   CALCIUM 8.4 04/26/2014 0601   GFRNONAA 88* 04/26/2014 0601   GFRAA >90 04/26/2014 0601     Assessment: s/p Procedure(s): HYSTERECTOMY ABDOMINAL (N/A) PANNICULECTOMY (N/A): unexpected hgb drop, likely due to intraabdominal oozing postop.. Will recheck Hgb in 6 hours, to assess for continued oozing, and  consider  options .  While stable, she may require evacuation of pelvic hematoma if hgb drop continues.  Plan: Advance diet Advance to PO medication Hgb in 6 hrs and again in a.m.  LOS: 1 day    Nazyia Gaugh V 04/26/2014, 8:37 AM

## 2014-04-26 NOTE — Progress Notes (Signed)
Notified MD that patient is requesting to have PCA removed.  Received orders to remove PCA pump and continue fluids.  Will continue to monitor patient.

## 2014-04-26 NOTE — Anesthesia Postprocedure Evaluation (Signed)
  Anesthesia Post-op Note  Patient: Samantha LightSarah H Schroeder  Procedure(s) Performed: Procedure(s): HYSTERECTOMY ABDOMINAL (N/A) PANNICULECTOMY (N/A)  Patient Location: Room 311  Anesthesia Type:General  Level of Consciousness: awake, alert , oriented and patient cooperative  Airway and Oxygen Therapy: Patient connected to nasal cannula oxygen  Post-op Pain: mild  Post-op Assessment: Post-op Vital signs reviewed, Patient's Cardiovascular Status Stable, Respiratory Function Stable, Patent Airway, No signs of Nausea or vomiting and Pain level controlled  Post-op Vital Signs: Reviewed and stable  Last Vitals:  Filed Vitals:   04/26/14 1006  BP: 106/65  Pulse: 89  Temp: 37.1 C  Resp: 13    Complications: No apparent anesthesia complications

## 2014-04-26 NOTE — Addendum Note (Signed)
Addendum created 04/26/14 1109 by Earleen NewportAmy A Adams, CRNA   Modules edited: Charges VN, Notes Section   Notes Section:  File: [id number]

## 2014-04-27 LAB — CBC WITH DIFFERENTIAL/PLATELET
Basophils Absolute: 0 10*3/uL (ref 0.0–0.1)
Basophils Relative: 0 % (ref 0–1)
Eosinophils Absolute: 0.1 10*3/uL (ref 0.0–0.7)
Eosinophils Relative: 2 % (ref 0–5)
HCT: 24.4 % — ABNORMAL LOW (ref 36.0–46.0)
Hemoglobin: 8.1 g/dL — ABNORMAL LOW (ref 12.0–15.0)
Lymphocytes Relative: 19 % (ref 12–46)
Lymphs Abs: 1.5 10*3/uL (ref 0.7–4.0)
MCH: 30.1 pg (ref 26.0–34.0)
MCHC: 33.2 g/dL (ref 30.0–36.0)
MCV: 90.7 fL (ref 78.0–100.0)
Monocytes Absolute: 0.7 10*3/uL (ref 0.1–1.0)
Monocytes Relative: 9 % (ref 3–12)
Neutro Abs: 5.3 10*3/uL (ref 1.7–7.7)
Neutrophils Relative %: 70 % (ref 43–77)
Platelets: 143 10*3/uL — ABNORMAL LOW (ref 150–400)
RBC: 2.69 MIL/uL — ABNORMAL LOW (ref 3.87–5.11)
RDW: 13.1 % (ref 11.5–15.5)
WBC: 7.5 10*3/uL (ref 4.0–10.5)

## 2014-04-27 MED ORDER — IBUPROFEN 600 MG PO TABS: 600.0000 mg | ORAL_TABLET | Freq: Four times a day (QID) | ORAL | Status: DC | PRN

## 2014-04-27 MED ORDER — OXYCODONE-ACETAMINOPHEN 5-325 MG PO TABS: 1.0000 | ORAL_TABLET | ORAL | Status: DC | PRN

## 2014-04-27 MED ORDER — ONDANSETRON HCL 4 MG PO TABS: 4.0000 mg | ORAL_TABLET | Freq: Four times a day (QID) | ORAL | Status: DC | PRN

## 2014-04-27 NOTE — Discharge Instructions (Signed)
Abdominal Hysterectomy, Care After Refer to this sheet in the next few weeks. These instructions provide you with information on caring for yourself after your procedure. Your health care provider may also give you more specific instructions. Your treatment has been planned according to current medical practices, but problems sometimes occur. Call your health care provider if you have any problems or questions after your procedure.  WHAT TO EXPECT AFTER THE PROCEDURE After your procedure, it is typical to have the following:  Pain.  Feeling tired.  Poor appetite.  Less interest in sex. HOME CARE INSTRUCTIONS  It takes 4-6 weeks to recover from this surgery. Make sure you follow all your health care provider's instructions. Home care instructions may include:  Take pain medicines only as directed by your health care provider. Do not take over-the-counter pain medicines without checking with your health care provider first.  Change your bandage as directed by your health care provider.  Return to your health care provider to have your sutures taken out.  Take showers instead of baths for 2-3 weeks. Ask your health care provider when it is safe to start showering.  Do not douche, use tampons, or have sexual intercourse for at least 6 weeks or until your health care provider says you can.   Follow your health care provider's advice about exercise, lifting, driving, and general activities.  Get plenty of rest and sleep.   Do not lift anything heavier than a gallon of milk (about 10 lb [4.5 kg]) for the first month after surgery.  You can resume your normal diet if your health care provider says it is okay.   Do not drink alcohol until your health care provider says you can.   If you are constipated, ask your health care provider if you can take a mild laxative.  Eating foods high in fiber may also help with constipation. Eat plenty of raw fruits and vegetables, whole grains, and  beans.  Drink enough fluids to keep your urine clear or pale yellow.   Try to have someone at home with you for the first 1-2 weeks to help around the house.  Keep all follow-up appointments. SEEK MEDICAL CARE IF:   You have chills or fever.  You have swelling, redness, or pain in the area of your incision that is getting worse.   You have pus coming from the incision.   You notice a bad smell coming from the incision or bandage.   Your incision breaks open.   You feel dizzy or light-headed.   You have pain or bleeding when you urinate.   You have persistent diarrhea.   You have persistent nausea and vomiting.   You have abnormal vaginal discharge.   You have a rash.   You have any type of abnormal reaction or develop an allergy to your medicine.   Your pain medicine is not helping.  SEEK IMMEDIATE MEDICAL CARE IF:   You have a fever and your symptoms suddenly get worse.  You have severe abdominal pain.  You have chest pain.  You have shortness of breath.  You faint.  You have pain, swelling, or redness of your leg.  You have heavy vaginal bleeding with blood clots. MAKE SURE YOU:  Understand these instructions.  Will watch your condition.  Will get help right away if you are not doing well or get worse. Document Released: 01/17/2005 Document Revised: 07/05/2013 Document Reviewed: 04/22/2013 Exit[employer] Patient Information 2015 [employer]. This information is not intended   to replace advice given to you by your health care provider. Make sure you discuss any questions you have with your health care provider.  

## 2014-04-27 NOTE — Discharge Summary (Signed)
Physician Discharge Summary  Patient ID: Samantha Schroeder MRN: [id number] DOB/AGE: 1971-06-06 43 y.o.  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 rate 20, height 5\' 4"  (1.626 m), last menstrual period 03/26/2014, SpO2 92.00%. General appearance: alert, cooperative and no distress GI: soft, non-tender; bowel sounds normal; no masses,  no organomegaly Incision/Wound:clean dry intact, JP with about 40 cc ouptut blood tinged  Disposition: 01-Home or Self Care  Discharge Instructions   Call MD for:  persistant nausea and vomiting    Complete by:  As directed      Call MD for:  severe uncontrolled pain    Complete by:  As directed      Call MD for:  temperature >100.4    Complete by:  As directed      Diet - low sodium heart healthy    Complete by:  As directed      Driving Restrictions    Complete by:  As directed   No driving for approximately 14 days post op due to abdominoplasty     Increase activity slowly    Complete by:  As directed      Leave dressing on - Keep it clean, dry, and intact until clinic visit    Complete by:  As directed      Lifting restrictions    Complete by:  As directed   No more than 10 pounds     Sexual Activity Restrictions    Complete by:  As directed   None until cleared by Dr Emelda FearFerguson            Medication List         ibuprofen 600 MG tablet  Commonly known as:  ADVIL,MOTRIN  Take 1 tablet (600 mg total) by mouth every 6 (six) hours as needed (mild  pain).     loratadine 10 MG tablet  Commonly known as:  CLARITIN  Take 10 mg by mouth daily as needed for allergies.     ondansetron 4 MG tablet  Commonly known as:  ZOFRAN  Take 1 tablet (4 mg total) by mouth every 6 (six) hours as needed for nausea.     oxyCODONE-acetaminophen 5-325 MG per tablet  Commonly known as:  PERCOCET/ROXICET  Take 1-2 tablets by mouth every 4 (four) hours as needed for severe pain (moderate to severe pain (when tolerating fluids)).     phentermine 37.5 MG tablet  Commonly known as:  ADIPEX-P  Take 37.5 mg by mouth daily before breakfast.     SUMAtriptan 50 MG tablet  Commonly known as:  IMITREX  Take 50 mg by mouth every 2 (two) hours as needed for migraine or headache. May repeat in 2 hours if headache persists or recurs.           Follow-up Information   Follow up with Tilda BurrowFERGUSON,JOHN V, MD In 1 week. (pt already has appointment scheduled)    Specialties:  Obstetrics and Gynecology, Radiology   Contact information:   [address] [place] [address] [phone]       Signed: Lazaro ArmsEURE,LUTHER H  04/27/2014, 9:21 AM

## 2014-04-27 NOTE — Progress Notes (Signed)
IV removed. Discharge instructions reviewed with patient. Understanding verbalized. Patient ready for discharge. Awaiting ride.

## 2014-04-27 NOTE — Clinical Documentation Improvement (Signed)
  PN 10/14 states "unexpected drop in Hgb". In the Coding world this term is nonspecific and noncodeable. Please clarify to reflect severity of illness and risk of mortality.  Possible Conditions . Precipitous drop in Hgb . Expected acute blood loss anemia . Acute blood loss anemia . Other condition  Supporting Information: -- Postop patient -- 10/8 12.6/37.7,  10/14 8.4/25.2,  10/15 8.1/24.4 -- Monitoring H/H  Thank Harle BattiestYou, Mayra Jolliffe RN [facility]I [phone] HIM department

## 2014-05-02 ENCOUNTER — Telehealth: Payer: Self-pay | Admitting: Obstetrics and Gynecology

## 2014-05-02 ENCOUNTER — Encounter: Payer: BC Managed Care – PPO | Admitting: Obstetrics and Gynecology

## 2014-05-02 NOTE — Telephone Encounter (Signed)
Pt states that she has taken laxative suppository and they have not worked, she did an enema and had no results, and another laxative today. Pt states that her last BM was Monday of last week. Pt had an abdominal hysterectomy last Tuesday. Pt states that she did not have a BM in the hospital. Pt wants to know what she can do for this. Pt states that she is having terrible stomach pain. Pt states that she has passed gas and it helped a little but she is still hurting.

## 2014-05-02 NOTE — Telephone Encounter (Signed)
I spoke with Drenda FreezeFran and she advised to take a daily dose of miralax. The pt requested mag citrate, I asked Drenda FreezeFran and she advised that would be ok as well. Pt verbalized understanding.

## 2014-05-03 ENCOUNTER — Encounter: Payer: Self-pay | Admitting: Obstetrics and Gynecology

## 2014-05-03 ENCOUNTER — Ambulatory Visit (INDEPENDENT_AMBULATORY_CARE_PROVIDER_SITE_OTHER): Payer: BC Managed Care – PPO | Admitting: Obstetrics and Gynecology

## 2014-05-03 ENCOUNTER — Other Ambulatory Visit: Payer: Self-pay | Admitting: Obstetrics and Gynecology

## 2014-05-03 ENCOUNTER — Telehealth: Payer: Self-pay | Admitting: *Deleted

## 2014-05-03 VITALS — BP 130/82 | Ht 64.0 in | Wt 153.0 lb

## 2014-05-03 DIAGNOSIS — Z9889 Other specified postprocedural states: Secondary | ICD-10-CM

## 2014-05-03 MED ORDER — SUMATRIPTAN SUCCINATE 50 MG PO TABS: 50.0000 mg | ORAL_TABLET | ORAL | Status: DC | PRN

## 2014-05-03 NOTE — Progress Notes (Addendum)
  Subjective:     Samantha Schroeder is a 43 y.o. female who presents to the clinic 8 days status post HYSTERECTOMY ABDOMINAL, Panniculectomy for Dysmenorrhea, Adenomyosis, Endometriosis. Diet:       regular without difficulty. Bowel function is: abnormal with constipation. Pain:     Pain is controlled with current analgesics. Medications being used: narcotic analgesics including oxycodone/acetaminophen (Percocet, Tylox).  Pt here today for post op visit. Pt states that she still has not had a BM and has tried everything she knows to do. Pt states that she is having lower back pain and pain under her incision site. Pt is not taking any pain medication at this time. Pt states that she is very constipated. She states that she is passing gas and she has been belching more than anything. She states that she had a fever a couple days ago of 99.4.  Review of Systems Pertinent items are noted in HPI.    Objective:    BP 130/82  Ht 5\' 4"  (1.626 m)  Wt 153 lb (69.4 kg)  BMI 26.25 kg/m2  LMP 03/26/2014 General:  alert and cooperative  Abdomen: soft, bowel sounds active, non-tender  Incision:   healing well, no drainage, no erythema, no hernia, no seroma, no swelling, mild bruising over the hips bilaterally, no dehiscence, incision well approximated JP drain removed        Pelvic: not indicated    Assessment:  JP drain removed without difficulty  Doing well postoperatively. Operative findings again reviewed. Pathology report discussed.    Plan:    1. Continue any current medications. 2. Wound care discussed. 3. Activity restrictions: limit activity to walking til we see you in 2 wk 4. Anticipated return to work: 2-3 weeks. 5. Follow up: 2 weeks for  Post op.     This chart was scribed for Tilda BurrowJohn Noya Santarelli V, MD by Chestine SporeSoijett Blue, ED Scribe. The patient was seen in room 1 at 10:44 AM.

## 2014-05-03 NOTE — Progress Notes (Signed)
Patient ID: Samantha LightSarah H Friske, female   DOB: 01/29/1971, 43 y.o.   MRN: [id number] Pt here today for post op visit. Pt states that she still has not had a BM and has tried everything she knows to do. Pt states that she is having lower back pain and pain under her incision site. Pt is not taking any pain medication at this time. Pt states that she is very constipated.

## 2014-05-04 ENCOUNTER — Telehealth: Payer: Self-pay | Admitting: *Deleted

## 2014-05-04 MED ORDER — SUMATRIPTAN SUCCINATE 50 MG PO TABS: 50.0000 mg | ORAL_TABLET | ORAL | Status: DC | PRN

## 2014-05-04 NOTE — Telephone Encounter (Signed)
Pt requesting refill for Imitrex 50 mg.

## 2014-05-04 NOTE — Telephone Encounter (Signed)
Pt informed Imitrex prescription faxed to pharmacy.

## 2014-05-09 DIAGNOSIS — Z029 Encounter for administrative examinations, unspecified: Secondary | ICD-10-CM

## 2014-05-17 ENCOUNTER — Encounter: Payer: Self-pay | Admitting: Obstetrics and Gynecology

## 2014-05-17 ENCOUNTER — Ambulatory Visit (INDEPENDENT_AMBULATORY_CARE_PROVIDER_SITE_OTHER): Payer: BC Managed Care – PPO | Admitting: Obstetrics and Gynecology

## 2014-05-17 VITALS — BP 118/82 | Ht 64.0 in | Wt 154.0 lb

## 2014-05-17 DIAGNOSIS — Z9889 Other specified postprocedural states: Secondary | ICD-10-CM

## 2014-05-17 MED ORDER — METRONIDAZOLE 0.75 % VA GEL: 1.0000 | Freq: Every day | VAGINAL | Status: DC

## 2014-05-17 NOTE — Progress Notes (Signed)
   [facility] Visit  Patient name: Samantha Schroeder MRN [id number]  Date of birth: 1971-06-30  CC & HPI:  Samantha LightSarah H Pribyl is a 43 y.o. female presenting today for 3 week post op visit after an abdominal hysterectomy and panniculectomy. She complains of occasional, intermittent sharp pains with a dull, nagging pain to the left side for the past 2 days. She reports increased physical activity over the last few days and believes this pain is related to that. Pt states that she may have an ingrown hair, and maybe a stitch that is coming through the skin. She has not been sexually active since the procedure.    ROS:  +mild abdominal pain   Pertinent History Reviewed:   Reviewed: Significant for ASCUS + High Risk HPV Medical         Past Medical History  Diagnosis Date  . Migraine   . 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 Breast surgery      reduction  . Wisdom tooth extraction    . Cholecystectomy      Dr Katrinka BlazingSmith  . Abdominal hysterectomy N/A 04/25/2014    Procedure: HYSTERECTOMY ABDOMINAL;  Surgeon: Tilda BurrowJohn Ruxin Ransome V, MD;  Location: [facility];  Service: Gynecology;  Laterality: N/A;  . Panniculectomy N/A 04/25/2014    Procedure: PANNICULECTOMY;  Surgeon: Tilda BurrowJohn Byrd Terrero V, MD;  Location: [facility];  Service: Gynecology;  Laterality: N/A;   Medications: Reviewed & Updated - see associated section                      Current outpatient prescriptions: loratadine (CLARITIN) 10 MG tablet, Take 10 mg by mouth daily as needed for allergies., Disp: , Rfl: ;  SUMAtriptan (IMITREX) 50 MG tablet, Take 1 tablet (50 mg total) by mouth every 2 (two) hours as needed for migraine or headache. May repeat in 2 hours if headache persists or recurs., Disp: 10 tablet, Rfl: 3   Social History: Reviewed -  reports that she has never smoked. She has never used smokeless tobacco.  Objective  Findings:  Vitals: Blood pressure 118/82, height 5\' 4"  (1.626 m), weight 154 lb (69.854 kg), last menstrual period 03/26/2014.  Physical Examination: General appearance - alert, well appearing, and in no distress and oriented to person, place, and time Mental status - alert, oriented to person, place, and time, normal mood, behavior, speech, dress, motor activity, and thought processes Abdomen - excellent healing of panniculectomy scar  Pelvic - VULVA: normal appearing vulva with no masses, tenderness or lesions, VAGINA: normal appearing vagina with normal color and discharge, no lesions, CERVIX: surgically absent, UTERUS: surgically absent, moderate stiffness of the vaginal cuff, ADNEXA: normal adnexa in size, nontender and no masses   Assessment & Plan:   A:  1. Post op state 2. Normal healing s/p hysterectomy with panniculectomy   P:  1. Follow up in 3 weeks for final exam Return to work now note given 3 Rx metrogel for use 2-3x/wk   This chart was scribed by Leone PayorSonum Patel, Medical Scribe, for Dr. Christin BachJohn Kerston Landeck on 05/17/14 at 10:10 AM. This chart was reviewed by Dr. Christin BachJohn Karis Emig for accuracy.

## 2014-06-02 ENCOUNTER — Telehealth: Payer: Self-pay | Admitting: Obstetrics and Gynecology

## 2014-06-02 NOTE — Telephone Encounter (Signed)
Pt states that Tuesday of this week was 5 weeks post op, went back to [employer]. Pt is having a paste like discharge,pt denies any odor, and some discomfort. Pt denies any irritation or itching. Pt states that it looks like a yeast infection but no symptoms other than the discharge. Pt states that she has an appointment Monday to see Dr. Emelda FearFerguson.  I spoke with Dr. Emelda FearFerguson and he advised to wait until Monday so we could determine exactly what is going on. But if it gets bad then she could get some monistat and just use it on the outside of the vagina not inside.   I advised the pt of this and she verbalized understanding.

## 2014-06-05 ENCOUNTER — Ambulatory Visit (INDEPENDENT_AMBULATORY_CARE_PROVIDER_SITE_OTHER): Payer: BC Managed Care – PPO | Admitting: Obstetrics and Gynecology

## 2014-06-05 ENCOUNTER — Encounter: Payer: Self-pay | Admitting: Obstetrics and Gynecology

## 2014-06-05 VITALS — BP 120/76 | Ht 64.0 in | Wt 153.0 lb

## 2014-06-05 DIAGNOSIS — Z9889 Other specified postprocedural states: Secondary | ICD-10-CM

## 2014-06-05 NOTE — Progress Notes (Signed)
Patient ID: Samantha Schroeder, female   DOB: April 17, 1971, 43 y.o.   MRN: [id number]   [facility]  Patient name: Samantha Schroeder MRN [id number]  Date of birth: April 17, 1971  CC & HPI:  Samantha Schroeder is a 43 y.o. female presenting today 5 wks s/p abdominal hysterectomy and panniculectomy.  She does not experience any pain during sex so long as she is in control.    ROS:  All systems have been reviewed and are negative unless otherwise specified in the HPI.   Pertinent History Reviewed:   Reviewed: Significant for  Medical         Past Medical History  Diagnosis Date  . Migraine   . 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 Breast surgery      reduction  . Wisdom tooth extraction    . Cholecystectomy      Dr Katrinka BlazingSmith  . Abdominal hysterectomy N/A 04/25/2014    Procedure: HYSTERECTOMY ABDOMINAL;  Surgeon: Tilda BurrowJohn Eria Lozoya V, MD;  Location: [facility];  Service: Gynecology;  Laterality: N/A;  . Panniculectomy N/A 04/25/2014    Procedure: PANNICULECTOMY;  Surgeon: Tilda BurrowJohn Nicey Krah V, MD;  Location: [facility];  Service: Gynecology;  Laterality: N/A;   Medications: Reviewed & Updated - see associated section                      Current outpatient prescriptions: loratadine (CLARITIN) 10 MG tablet, Take 10 mg by mouth daily as needed for allergies., Disp: , Rfl:    Social History: Reviewed -  reports that she has never smoked. She has never used smokeless tobacco.  Objective Findings:  Vitals: Blood pressure 120/76, height 5\' 4"  (1.626 m), weight 153 lb (69.4 kg), last menstrual period 03/26/2014.  Physical Examination: General appearance - alert, well appearing, and in no distress, oriented to person, place, and time and normal appearing weight Pelvic - VULVA: normal appearing vulva with no masses, tenderness or lesions,  VAGINA: normal appearing vagina with normal color  and discharge, no lesions,  CERVIX: surgically absent UTERUS: surgically absent ADNEXA: normal adnexa in size, nontender and no masses  Assessment & Plan:   A:  1. S/p abdominal hysterectomy panniculectomy 2. Full release  P:  1. Follow-up PRN  This chart was scribed for Tilda BurrowJohn Laura-Lee Villegas V, MD by Carl Bestelina Holson, ED Scribe. This patient was seen in Room 2 and the patient's care was started at 3:51 PM.

## 2014-06-07 ENCOUNTER — Ambulatory Visit: Payer: BC Managed Care – PPO | Admitting: Obstetrics and Gynecology

## 2014-06-28 ENCOUNTER — Ambulatory Visit (INDEPENDENT_AMBULATORY_CARE_PROVIDER_SITE_OTHER): Payer: BC Managed Care – PPO | Admitting: Obstetrics and Gynecology

## 2014-06-28 ENCOUNTER — Encounter: Payer: Self-pay | Admitting: Obstetrics and Gynecology

## 2014-06-28 VITALS — BP 120/80 | Ht 64.0 in | Wt 154.0 lb

## 2014-06-28 DIAGNOSIS — N83202 Unspecified ovarian cyst, left side: Secondary | ICD-10-CM

## 2014-06-28 DIAGNOSIS — Z9889 Other specified postprocedural states: Secondary | ICD-10-CM

## 2014-06-28 MED ORDER — NORGESTIMATE-ETH ESTRADIOL 0.25-35 MG-MCG PO TABS: 1.0000 | ORAL_TABLET | Freq: Every day | ORAL | Status: DC

## 2014-06-28 MED ORDER — TRAMADOL HCL 50 MG PO TABS: 50.0000 mg | ORAL_TABLET | Freq: Four times a day (QID) | ORAL | Status: DC | PRN

## 2014-06-28 NOTE — Progress Notes (Signed)
[facility] Visit  Patient name: Samantha Schroeder MRN [id number]  Date of birth: 1971-02-26  CC & HPI:  Samantha Schroeder is a 43 y.o. female presenting today for severe pelvic pain that began on Monday around 6:30 PM. Low back pain that radiates to the front middle of her stomach that is a sharp stabbing pain. She was doing okay until she went to zumba on Monday. She reports that this was not her first time going to the exercise course. She denies any other symptoms. She has been sexually active without pain.   ROS:  +Low back pain No other complaints.  Pertinent History Reviewed:   Reviewed: Significant for  Medical         Past Medical History  Diagnosis Date  . Migraine   . 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 Breast surgery      reduction  . Wisdom tooth extraction    . Cholecystectomy      Dr Katrinka BlazingSmith  . Abdominal hysterectomy N/A 04/25/2014    Procedure: HYSTERECTOMY ABDOMINAL;  Surgeon: Tilda BurrowJohn Treyvion Durkee V, MD;  Location: [facility];  Service: Gynecology;  Laterality: N/A;  . Panniculectomy N/A 04/25/2014    Procedure: PANNICULECTOMY;  Surgeon: Tilda BurrowJohn Kristina Bertone V, MD;  Location: [facility];  Service: Gynecology;  Laterality: N/A;   Medications: Reviewed & Updated - see associated section                      Current outpatient prescriptions: ibuprofen (ADVIL,MOTRIN) 600 MG tablet, Take 600 mg by mouth every 6 (six) hours as needed., Disp: , Rfl:    Social History: Reviewed -  reports that she has never smoked. She has never used smokeless tobacco.  Objective Findings:  Vitals: Blood pressure 120/80, height 5\' 4"  (1.626 m), weight 154 lb (69.854 kg), last menstrual period 03/26/2014.  Physical Examination:  General appearance - alert, well appearing, and in no distress and oriented to person, place, and time Mental status - alert, oriented to person, place,  and time, normal mood, behavior, speech, dress, motor activity, and thought processes Pelvic - normal external genitalia, vulva, vagina, cervix, uterus and adnexa,  VULVA: normal appearing vulva with no masses, tenderness or lesions,  VAGINA: normal appearing vagina with normal color and discharge, no lesions,  CERVIX: surgically absent,  UTERUS: surgically absent, vaginal cuff well healed, granulation tissue on the vaginal cuff that needs chemical treatment,  ADNEXA: normal adnexa in size, nontender and no masses  6.1 cm x 7.5 cm x 7.5 cm cystic structure on Left ovary with a thin septation Two small cyst on Left ovary 2.7 cm solid area consisted with o Varian cyst  Assessment & Plan:   A:  1. 2051w2d s/p abdominal hysterectomy and panniculectomy 2. Granulation tissue on vaginal cuff that needs chemical treatment 3. 6.1 cm x 7.5 cm x 7.5 cm cystic structure on Left ovary with a thin septation 4. Two small cyst on Left ovary 2.7 cm solid area consisted with functional cysts.  P:  1. Granulation tissue of vag cuff treated in office 2. Birth control pill Rx to suppress left ovary 3. Reduce to  low impact exercises 4. Continue  ibuprofen 600 mg along with Tramadol Rx   This chart was scribed for Tilda BurrowJohn Amyre Segundo V, MD by Chestine SporeSoijett Blue, ED Scribe. The patient was seen in room 1 at 3:58 PM.

## 2014-06-28 NOTE — Progress Notes (Signed)
Patient ID: Samantha LightSarah H Schroeder, female   DOB: [birth year], 43 y.o.   MRN: [id number] Pt worked in today for severe pain. Pt states that since Monday around 6:30 that evening pain started

## 2014-07-25 ENCOUNTER — Other Ambulatory Visit: Payer: Self-pay | Admitting: Obstetrics and Gynecology

## 2014-07-25 DIAGNOSIS — N83202 Unspecified ovarian cyst, left side: Secondary | ICD-10-CM

## 2014-07-28 ENCOUNTER — Ambulatory Visit: Payer: BC Managed Care – PPO | Admitting: Obstetrics and Gynecology

## 2014-07-28 ENCOUNTER — Encounter: Payer: Self-pay | Admitting: Obstetrics and Gynecology

## 2014-07-28 ENCOUNTER — Other Ambulatory Visit: Payer: BC Managed Care – PPO

## 2014-07-28 ENCOUNTER — Ambulatory Visit (INDEPENDENT_AMBULATORY_CARE_PROVIDER_SITE_OTHER): Payer: BLUE CROSS/BLUE SHIELD | Admitting: Obstetrics and Gynecology

## 2014-07-28 ENCOUNTER — Other Ambulatory Visit: Payer: Self-pay | Admitting: Obstetrics and Gynecology

## 2014-07-28 ENCOUNTER — Ambulatory Visit (INDEPENDENT_AMBULATORY_CARE_PROVIDER_SITE_OTHER): Payer: BLUE CROSS/BLUE SHIELD

## 2014-07-28 VITALS — BP 122/86 | Ht 64.0 in

## 2014-07-28 DIAGNOSIS — N83202 Unspecified ovarian cyst, left side: Secondary | ICD-10-CM

## 2014-07-28 DIAGNOSIS — N8320 Unspecified ovarian cysts: Secondary | ICD-10-CM

## 2014-07-28 NOTE — Progress Notes (Signed)
Patient ID: Samantha LightSarah H Hawn, female   DOB: July 27, 1970, 44 y.o.   MRN: [id number] Pt here today for US and then to see Dr. Emelda FearFerguson. PT denies any problems or concerns at this time.  U/s reviewed with pt .  Cyst resolved.  Pt with minimal discomfort with the visit.and u/s A resolved ov cyst. P: stop ocp     Resume prn recurrent pain/cysts   Fu/prn

## 2014-11-23 ENCOUNTER — Ambulatory Visit (INDEPENDENT_AMBULATORY_CARE_PROVIDER_SITE_OTHER): Payer: 59 | Admitting: Advanced Practice Midwife

## 2014-11-23 ENCOUNTER — Encounter: Payer: Self-pay | Admitting: Advanced Practice Midwife

## 2014-11-23 VITALS — BP 116/64 | HR 64 | Wt 155.0 lb

## 2014-11-23 DIAGNOSIS — N390 Urinary tract infection, site not specified: Secondary | ICD-10-CM

## 2014-11-23 DIAGNOSIS — R319 Hematuria, unspecified: Secondary | ICD-10-CM

## 2014-11-23 LAB — POCT URINALYSIS DIPSTICK
Glucose, UA: NEGATIVE
Leukocytes, UA: NEGATIVE
Nitrite, UA: NEGATIVE
Protein, UA: NEGATIVE

## 2014-11-23 MED ORDER — CIPROFLOXACIN HCL 500 MG PO TABS: 500.0000 mg | ORAL_TABLET | Freq: Two times a day (BID) | ORAL | Status: DC

## 2014-11-23 NOTE — Progress Notes (Signed)
[facility] Visit  Patient name: Samantha LightSarah H Keysor MRN [id number]  Date of birth: 1970-11-28  CC & HPI:  Samantha Schroeder is a 44 y.o. Caucasian female presenting today for C/O feeling like she has a UTI.  She c/o suprapubic pain, LBP, 99.0 temp, nausea since yesterday, getting worse.  Denies dysuria, frequency.  Pertinent History Reviewed:  Medical & Surgical Hx:   Past Medical History  Diagnosis Date  . Migraine   . 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 Breast surgery      reduction  . Wisdom tooth extraction    . Cholecystectomy      Dr Katrinka BlazingSmith  . Abdominal hysterectomy N/A 04/25/2014    Procedure: HYSTERECTOMY ABDOMINAL;  Surgeon: Tilda BurrowJohn Ferguson V, MD;  Location: [facility];  Service: Gynecology;  Laterality: N/A;  . Panniculectomy N/A 04/25/2014    Procedure: PANNICULECTOMY;  Surgeon: Tilda BurrowJohn Ferguson V, MD;  Location: [facility];  Service: Gynecology;  Laterality: N/A;   Family History  Problem Relation Age of Onset  . Hypertension Mother   . Cancer Mother     skin  . Hypertension Sister   . Heart disease Maternal Grandmother   . Diabetes Maternal Grandfather   . Hypertension Maternal Grandfather     Current outpatient prescriptions:  .  loratadine (CLARITIN) 10 MG tablet, Take 10 mg by mouth daily., Disp: , Rfl:  .  buPROPion (WELLBUTRIN) 75 MG tablet, Take 75 mg by mouth at bedtime., Disp: , Rfl:  .  ciprofloxacin (CIPRO) 500 MG tablet, Take 1 tablet (500 mg total) by mouth 2 (two) times daily., Disp: 6 tablet, Rfl: 0 Social History: Reviewed -  reports that she has never smoked. She has never used smokeless tobacco.  Review of Systems:   Constitutional: Negative for fever and chills Eyes: Negative for visual disturbances Respiratory: Negative for shortness of breath, dyspnea Cardiovascular: Negative for chest pain or palpitations  Gastrointestinal: Negative for vomiting, diarrhea and  constipation; no abdominal pain Genitourinary: Negative for dysuria and urgency, vaginal irritation or itching Musculoskeletal: Negative for back pain, joint pain, myalgias  Neurological: Negative for dizziness and headaches    Objective Findings:  Vitals: BP 116/64 mmHg  Pulse 64  Wt 155 lb (70.308 kg)  LMP 03/26/2014  Physical Examination: General appearance - alert, well appearing, and in no distress Mental status - alert, oriented to person, place, and time Chest - normal resp effort Abdomen - tenderness noted suprapubic area/ no guarding/rebound tenderness Back exam - no CVAT  Results for orders placed or performed in visit on 11/23/14 (from the past 24 hour(s))  POCT Urinalysis Dipstick   Collection Time: 11/23/14 11:53 AM  Result Value Ref Range   Color, UA yellow    Clarity, UA cloudy    Glucose, UA neg    Bilirubin, UA     Ketones, UA small    Spec Grav, UA     Blood, UA large    pH, UA     Protein, UA neg    Urobilinogen, UA     Nitrite, UA neg    Leukocytes, UA Negative        Assessment & Plan:  A:   UTI:   P:  Culture urine.  Rx cipro 500mg  BID X3   F/U prn   CRESENZO-DISHMAN,Karenna Romanoff CNM 11/23/2014 12:15 PM

## 2014-11-25 LAB — URINE CULTURE: Organism ID, Bacteria: NO GROWTH

## 2014-12-29 ENCOUNTER — Other Ambulatory Visit (HOSPITAL_COMMUNITY): Payer: Self-pay | Admitting: Family Medicine

## 2014-12-29 DIAGNOSIS — Z1231 Encounter for screening mammogram for malignant neoplasm of breast: Secondary | ICD-10-CM

## 2015-01-22 ENCOUNTER — Ambulatory Visit (HOSPITAL_COMMUNITY): Payer: Self-pay

## 2015-03-13 ENCOUNTER — Ambulatory Visit: Payer: Self-pay | Admitting: Urology

## 2019-01-18 ENCOUNTER — Telehealth: Payer: Self-pay | Admitting: *Deleted

## 2019-01-18 NOTE — Telephone Encounter (Signed)
I called patient and made her aware of appt info and covid restrictions

## 2019-01-19 ENCOUNTER — Encounter: Payer: Self-pay | Admitting: Obstetrics and Gynecology

## 2019-01-19 ENCOUNTER — Other Ambulatory Visit: Payer: Self-pay

## 2019-01-19 ENCOUNTER — Ambulatory Visit (INDEPENDENT_AMBULATORY_CARE_PROVIDER_SITE_OTHER): Payer: BC Managed Care – PPO | Admitting: Obstetrics and Gynecology

## 2019-01-19 VITALS — BP 131/89 | HR 76 | Ht 64.0 in | Wt 202.2 lb

## 2019-01-19 DIAGNOSIS — R635 Abnormal weight gain: Secondary | ICD-10-CM | POA: Diagnosis not present

## 2019-01-19 DIAGNOSIS — N951 Menopausal and female climacteric states: Secondary | ICD-10-CM | POA: Diagnosis not present

## 2019-01-19 MED ORDER — ESTRADIOL 1 MG PO TABS: 1.0000 mg | ORAL_TABLET | Freq: Every day | ORAL | 12 refills | Status: DC

## 2019-01-19 NOTE — Progress Notes (Signed)
Patient ID: Samantha Schroeder, female   DOB: 1971/03/13, 48 y.o.   MRN: [id number]    [facility] Visit  @DATE @            Patient name: Samantha Schroeder MRN [id number]  Date of birth: 1971-05-03  CC & HPI:  Samantha Schroeder is a 48 y.o. female presenting today for possible HRT. Night sweats, and hot flashes, insomnia, fatigue, weight gain over past year is 30 lbs. Has been under situational stress. April 2019 son had terrible car accident and almost died. Son is doing much better now, things are getting back to normal. Skin & vaginal dryness, slight depression, decreased libido. Has pain on left side but not to bothersome. Denies hx and fhx of DVT & PE  ROS:  ROS Refer to HPI  Pertinent History Reviewed:   Reviewed: hysterectomy 2015 Medical         Past Medical History:  Diagnosis Date   Migraine    Vaginal Pap smear, abnormal 07/2013   ASCUS + High risk HPV                              Surgical Hx:    Past Surgical History:  Procedure Laterality Date   ABDOMINAL HYSTERECTOMY N/A 04/25/2014   Procedure: HYSTERECTOMY ABDOMINAL;  Surgeon: Jonnie Kind, MD;  Location: [facility];  Service: Gynecology;  Laterality: N/A;   BREAST SURGERY     reduction   CHOLECYSTECTOMY     Dr Tamala Julian   PANNICULECTOMY N/A 04/25/2014   Procedure: PANNICULECTOMY;  Surgeon: Jonnie Kind, MD;  Location: [facility];  Service: Gynecology;  Laterality: N/A;   WISDOM TOOTH EXTRACTION     Medications: Reviewed & Updated - see associated section                       Current Outpatient Medications:    buPROPion (WELLBUTRIN) 75 MG tablet, Take 150 mg by mouth at bedtime. , Disp: , Rfl:    escitalopram (LEXAPRO) 20 MG tablet, Take 20 mg by mouth daily., Disp: , Rfl:    Social History: Reviewed -  reports that she has never smoked. She has never used smokeless tobacco.  Objective Findings:  Vitals: Blood pressure 131/89, pulse 76, height 5\' 4"  (1.626 m), weight 202 lb 3.2 oz (91.7 kg), last  menstrual period 03/26/2014.  PHYSICAL EXAMINATION General appearance - alert, well appearing, and in no distress Mental status - alert, oriented to person, place, and time, normal mood, behavior, speech, dress, motor activity, and thought processes  PELVIC deferred  Assessment & Plan:   A:  1. Perimenopause 2. Situational stress and depression 3. Weight gain 4. HRT  P:  1. Willis 2. TSH 3. CBC 4. CMET 5. Rx estrace 1 mg daily 6. F/u PRN    By signing my name below, I, Samul Dada, attest that this documentation has been prepared under the direction and in the presence of Jonnie Kind, MD. Electronically Signed: Lovelady. 01/19/19. 3:47 PM.  I personally performed the services described in this documentation, which was SCRIBED in my presence. The recorded information has been reviewed and considered accurate. It has been edited as necessary during review. Jonnie Kind, MD

## 2019-01-19 NOTE — Patient Instructions (Signed)

## 2019-02-17 ENCOUNTER — Other Ambulatory Visit (HOSPITAL_COMMUNITY): Payer: Self-pay | Admitting: Family Medicine

## 2019-02-17 DIAGNOSIS — Z1231 Encounter for screening mammogram for malignant neoplasm of breast: Secondary | ICD-10-CM

## 2019-02-21 ENCOUNTER — Other Ambulatory Visit: Payer: Self-pay

## 2019-02-21 DIAGNOSIS — Z20822 Contact with and (suspected) exposure to covid-19: Secondary | ICD-10-CM

## 2019-02-22 LAB — NOVEL CORONAVIRUS, NAA: SARS-CoV-2, NAA: NOT DETECTED

## 2019-03-10 ENCOUNTER — Other Ambulatory Visit (HOSPITAL_COMMUNITY): Payer: Self-pay | Admitting: Family Medicine

## 2019-03-10 DIAGNOSIS — R928 Other abnormal and inconclusive findings on diagnostic imaging of breast: Secondary | ICD-10-CM

## 2019-03-22 ENCOUNTER — Ambulatory Visit (HOSPITAL_COMMUNITY): Payer: BC Managed Care – PPO

## 2019-03-22 ENCOUNTER — Ambulatory Visit (HOSPITAL_COMMUNITY): Admission: RE | Admit: 2019-03-22 | Payer: BC Managed Care – PPO | Source: Ambulatory Visit

## 2019-03-22 ENCOUNTER — Other Ambulatory Visit: Payer: Self-pay

## 2019-03-22 ENCOUNTER — Ambulatory Visit (HOSPITAL_COMMUNITY)
Admission: RE | Admit: 2019-03-22 | Discharge: 2019-03-22 | Disposition: A | Discharge status: Home / Self Care | Payer: BC Managed Care – PPO | Source: Ambulatory Visit | Attending: Family Medicine | Admitting: Family Medicine

## 2019-03-22 ENCOUNTER — Encounter (HOSPITAL_COMMUNITY): Payer: Self-pay

## 2019-03-22 DIAGNOSIS — R928 Other abnormal and inconclusive findings on diagnostic imaging of breast: Secondary | ICD-10-CM | POA: Diagnosis present

## 2019-03-24 ENCOUNTER — Ambulatory Visit (HOSPITAL_COMMUNITY): Payer: Self-pay

## 2020-02-27 ENCOUNTER — Other Ambulatory Visit: Payer: Self-pay | Admitting: Obstetrics and Gynecology

## 2020-02-27 MED ORDER — ESTRADIOL 1 MG PO TABS: 1.0000 mg | ORAL_TABLET | Freq: Every day | ORAL | 12 refills | Status: DC

## 2020-02-27 NOTE — Progress Notes (Signed)
Estrace refilled. No evidence of change in condition

## 2020-03-05 ENCOUNTER — Other Ambulatory Visit: Payer: Self-pay | Admitting: Obstetrics and Gynecology

## 2020-03-17 IMAGING — MG MM DIGITAL DIAGNOSTIC BILAT W/ TOMO W/ CAD
8 series · 8 of 24 positions shown · non-contrast
Comparison: June 03, 2011

CLINICAL DATA: 48-year-old patient whose last mammogram was in 4034
at which time she had probably benign findings related to
superficial thrombophlebitis of the lateral left breast. Currently,
the patient is asymptomatic, with no pain or lump in either breast.

EXAM:
DIGITAL DIAGNOSTIC BILATERAL MAMMOGRAM WITH CAD AND TOMO

[R CC synth-2D]
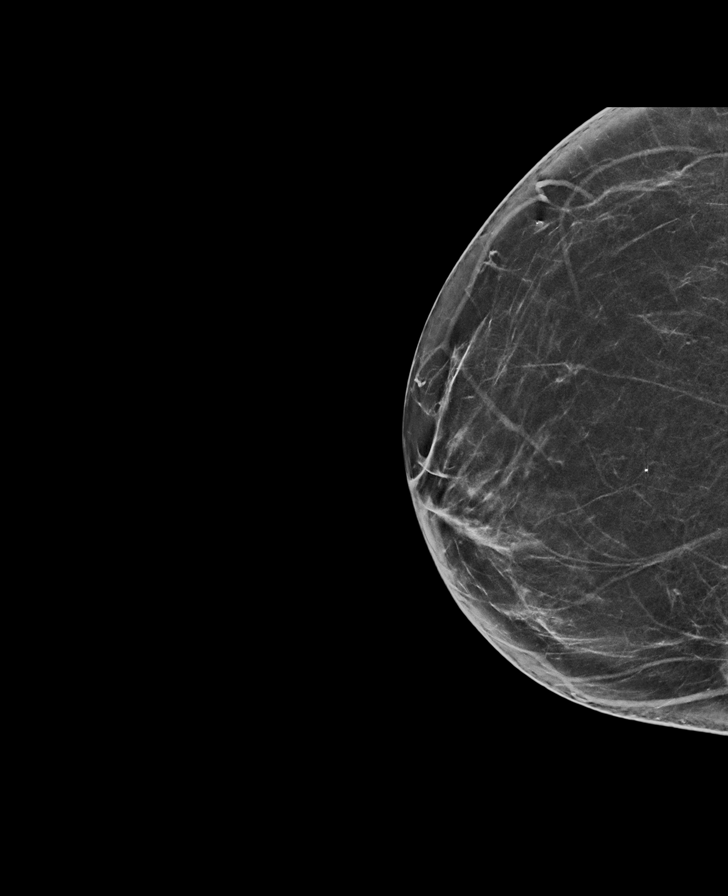

[R MLO synth-2D]
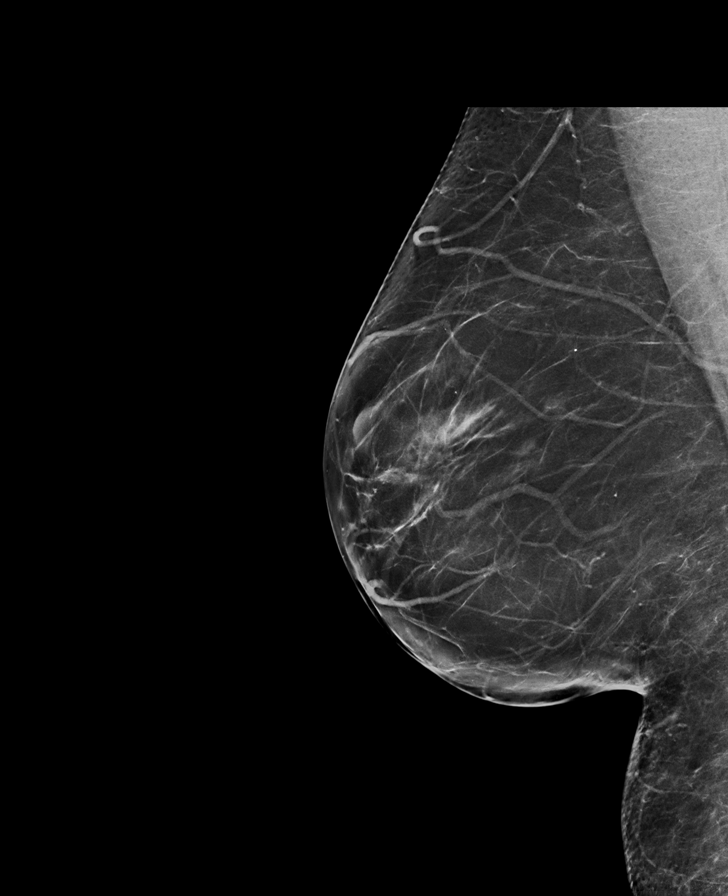

[L MLO synth-2D]
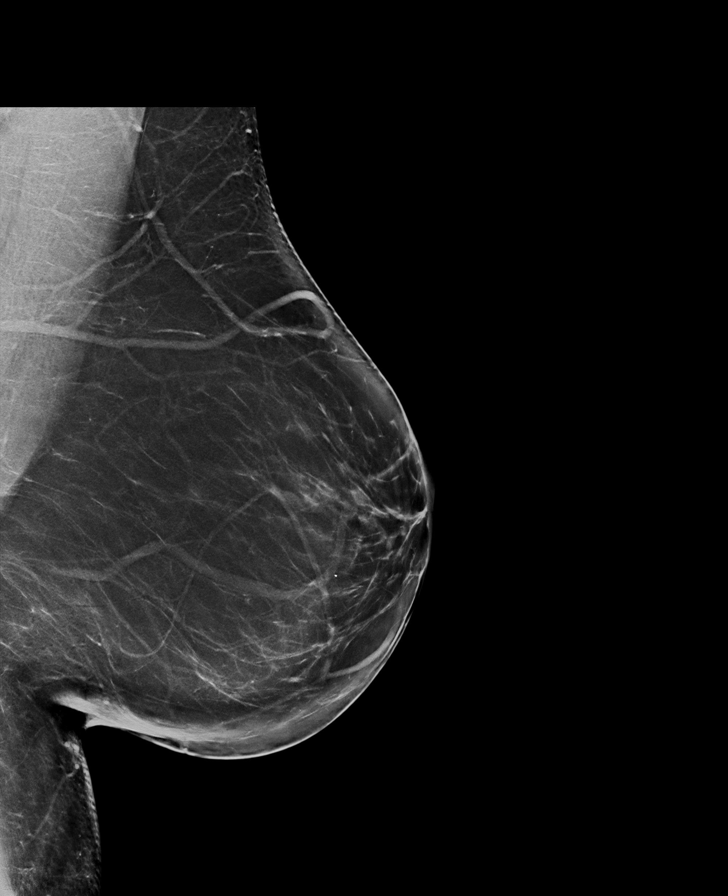

[L CC synth-2D]
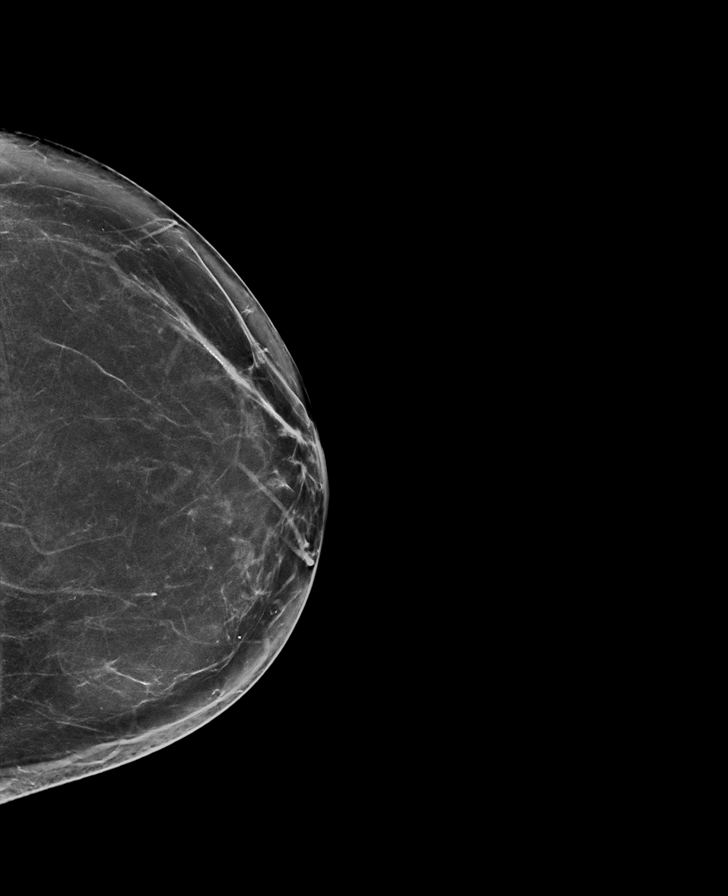

[R MLO tomo · tomo slice 40/79.0]
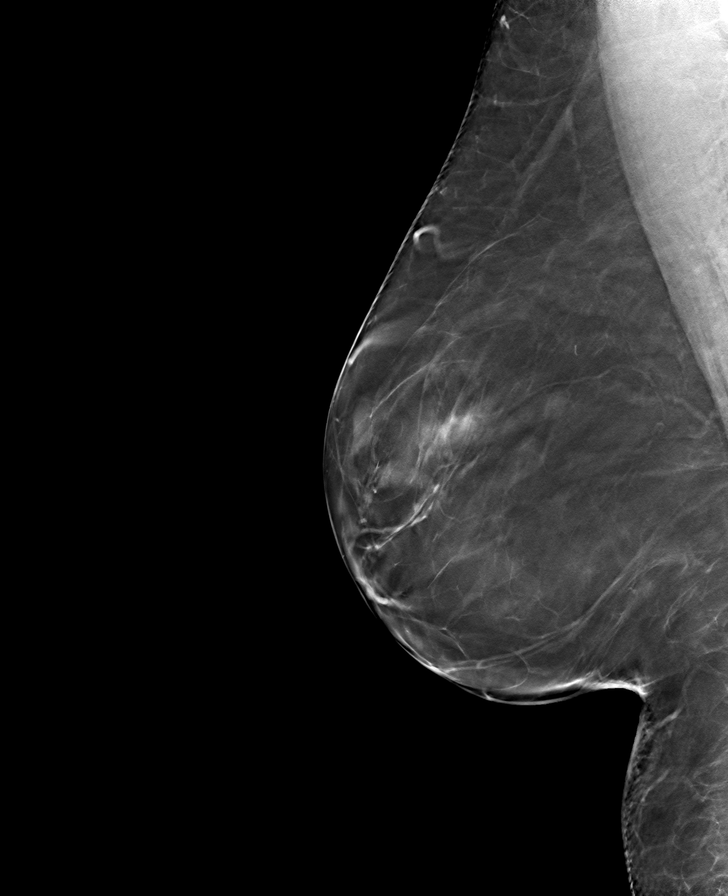

[L CC tomo · tomo slice 37/74.0]
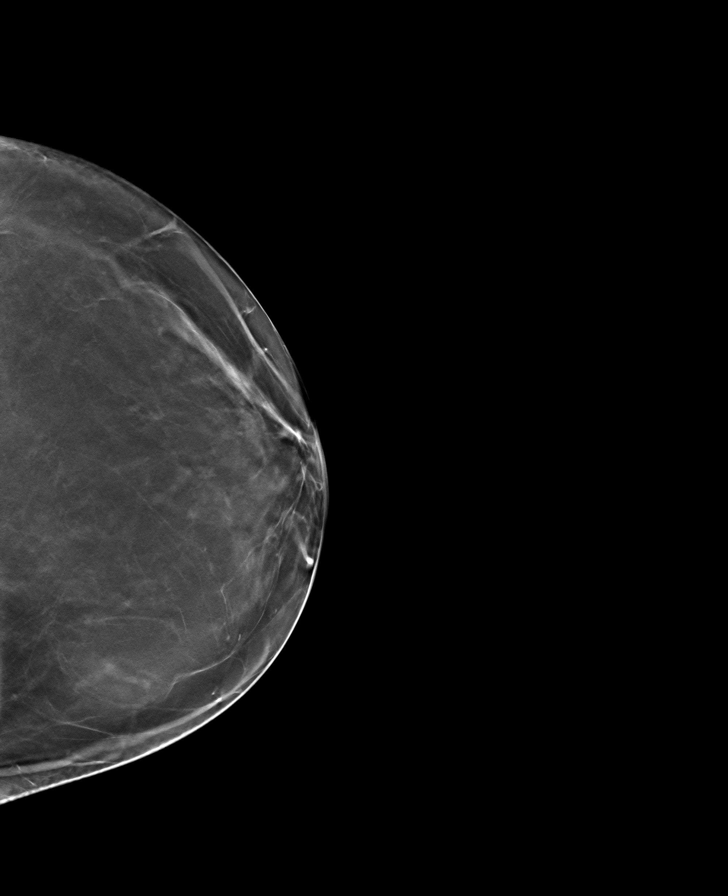

[R CC tomo · tomo slice 33/65.0]
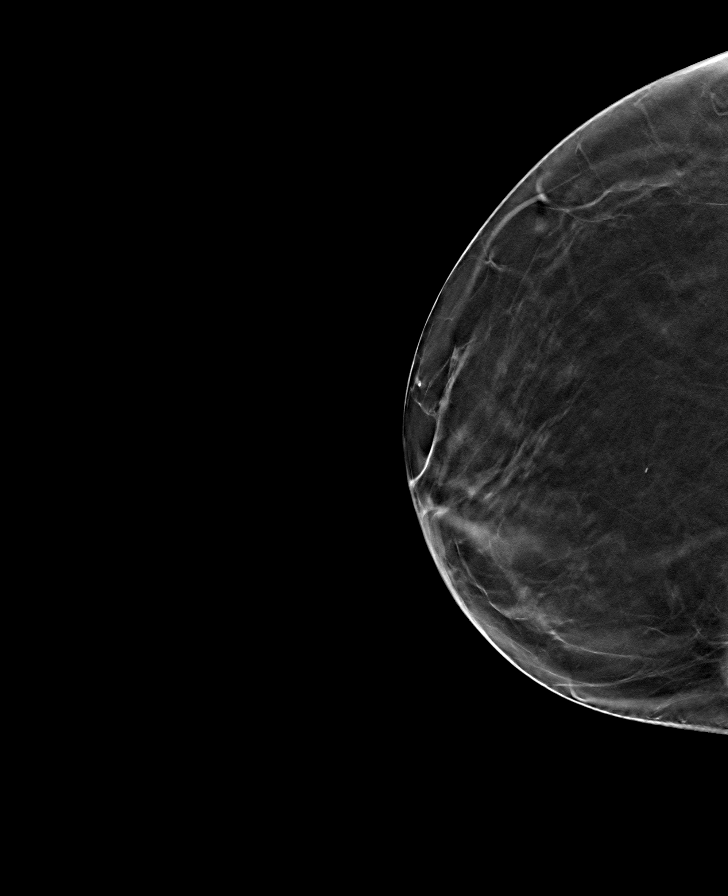

[L MLO tomo · tomo slice 45/88.0]
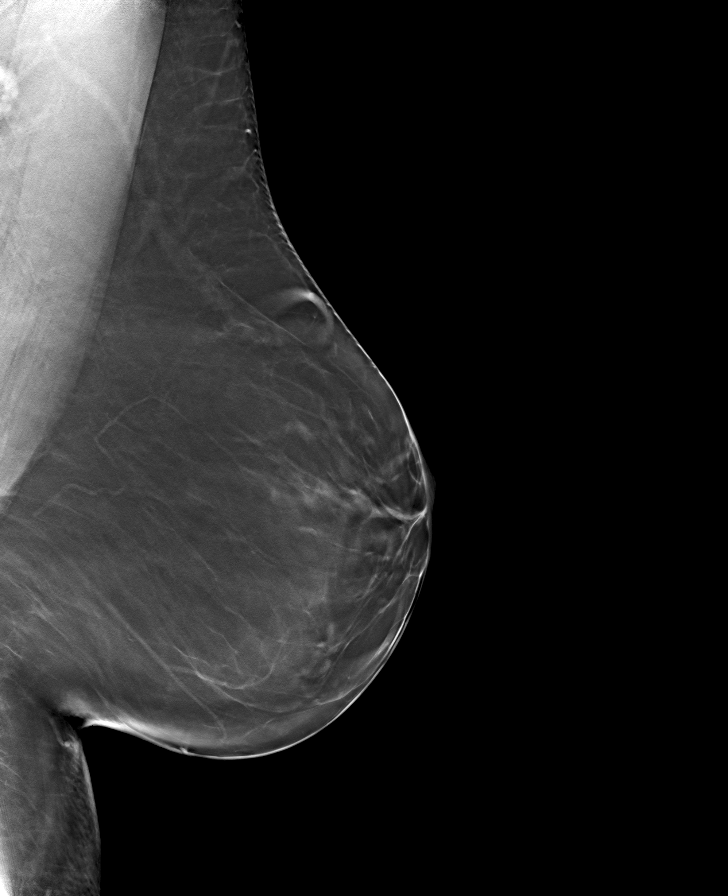

[8 of 24 positions shown; findings below may reference images not displayed]

ACR Breast Density Category b: There are scattered areas of
fibroglandular density.
FINDINGS: No mass, architectural distortion, or suspicious microcalcification
is identified to suggest malignancy in either breast.

Mammographic images were processed with CAD.
IMPRESSION: No evidence of malignancy in either breast.

RECOMMENDATION:
Screening mammogram in one year.(Code:76-U-AHH)

I have discussed the findings and recommendations with the patient.
If applicable, a reminder letter will be sent to the patient
regarding the next appointment.

BI-RADS CATEGORY  1: Negative.
